# Patient Record
Sex: Male | Born: 1965 | Race: White | Hispanic: No | Smoking: Never smoker
Health system: Southern US, Community
[De-identification: ages and names within clinical notes are randomized; demographics above are authoritative.]

## PROBLEM LIST (undated history)

## (undated) DIAGNOSIS — I10 Essential (primary) hypertension: Secondary | ICD-10-CM

## (undated) DIAGNOSIS — E119 Type 2 diabetes mellitus without complications: Secondary | ICD-10-CM

## (undated) HISTORY — PX: BACK SURGERY: SHX140

## (undated) HISTORY — PX: KNEE SURGERY: SHX244

---

## 1998-08-25 ENCOUNTER — Encounter: Payer: Self-pay | Admitting: *Deleted

## 1998-08-25 ENCOUNTER — Ambulatory Visit (HOSPITAL_COMMUNITY): Admission: RE | Admit: 1998-08-25 | Discharge: 1998-08-25 | Payer: Self-pay | Admitting: *Deleted

## 2002-04-08 ENCOUNTER — Encounter: Payer: Self-pay | Admitting: Emergency Medicine

## 2002-04-08 ENCOUNTER — Emergency Department (HOSPITAL_COMMUNITY): Admission: AC | Admit: 2002-04-08 | Discharge: 2002-04-08 | Payer: Self-pay

## 2002-04-11 ENCOUNTER — Emergency Department (HOSPITAL_COMMUNITY): Admission: EM | Admit: 2002-04-11 | Discharge: 2002-04-11 | Payer: Self-pay | Admitting: Emergency Medicine

## 2002-05-07 ENCOUNTER — Encounter: Payer: Self-pay | Admitting: Family Medicine

## 2002-05-07 ENCOUNTER — Encounter: Admission: RE | Admit: 2002-05-07 | Discharge: 2002-05-07 | Payer: Self-pay | Admitting: Family Medicine

## 2002-06-23 ENCOUNTER — Ambulatory Visit (HOSPITAL_COMMUNITY): Admission: RE | Admit: 2002-06-23 | Discharge: 2002-06-23 | Payer: Self-pay | Admitting: Orthopedic Surgery

## 2002-06-23 ENCOUNTER — Encounter: Payer: Self-pay | Admitting: Orthopedic Surgery

## 2004-05-24 ENCOUNTER — Emergency Department (HOSPITAL_COMMUNITY): Admission: EM | Admit: 2004-05-24 | Discharge: 2004-05-24 | Payer: Self-pay | Admitting: Emergency Medicine

## 2006-11-19 ENCOUNTER — Ambulatory Visit: Payer: Self-pay | Admitting: Cardiovascular Disease

## 2006-11-19 ENCOUNTER — Observation Stay (HOSPITAL_COMMUNITY): Admission: EM | Admit: 2006-11-19 | Discharge: 2006-11-20 | Payer: Self-pay | Admitting: Emergency Medicine

## 2006-11-29 ENCOUNTER — Ambulatory Visit: Payer: Self-pay

## 2010-09-27 NOTE — Discharge Summary (Signed)
NAME:  HALE, Adrian Gonzalez NO.:  [id number]   MEDICAL RECORD NO.:  [id number]          PATIENT TYPE:  INP   LOCATION:  2041                         FACILITY:  [facility]   PHYSICIAN:  Noralyn Pick. Eden Emms, MD, FACCDATE OF BIRTH:  01/23/1966   DATE OF ADMISSION:  11/19/2006  DATE OF DISCHARGE:                               DISCHARGE SUMMARY   PRIMARY CARE Caroll Weinheimer:  Lonie Peak, PA-C   PRIMARY CARDIOLOGIST:  Dr. Charlton Haws   DISCHARGE DIAGNOSIS:  Chest pain.   SECONDARY DIAGNOSES:  1. Hypertension.  2. Anxiety.  3. History of back surgery in 1998.  4. History of left knee surgery in 1996.   ALLERGIES:  PENICILLIN.   PROCEDURES:  Chest CT.   HISTORY OF PRESENT ILLNESS:  This is a 45 year old Caucasian male with  prior history of hypertension and anxiety attacks who was in his usual  state of health until approximately 10:30 a.m. on November 19, 2006, when  while walking, he developed 5/10 mid sternal chest discomfort associated  with dizziness, nausea, diaphoresis, and chills, partially relieved by  one packet of BC Powder.  The patient presented to the [facility] ED  where ECG showed no acute changes and cardiac markers were negative.  He  was admitted for further evaluation.   HOSPITAL COURSE:  Mr. Dicenzo ruled out for MI by cardiac markers.  A CT  of the chest was performed and was negative for pulmonary embolus.  He  has not had any recurrent chest discomfort and will be discharged home  today in satisfactory condition.  The patient has complained of some  anxiety and we have recommended primary care and/or outpatient  psychiatry followup this week.   DISCHARGE LABORATORIES:  Hemoglobin 14.8, hematocrit 42.3, WBC 4.2,  platelets 180, MCV 89.9, neutrophils 58.  Sodium 137, potassium 4.1,  chloride 106, CO2 27, BUN 9, creatinine 1.00, glucose 114.  PT 12.6, INR  0.9, PTT 25.  CK 138, MB 2.7, troponin I 0.01.  Total cholesterol 146,  triglycerides 128, HDL 27, LDL  93.  Calcium 8.9, magnesium 1.9.   DISPOSITION:  The patient is being discharged home today in good  condition.   FOLLOWUP PLANS AND APPOINTMENTS:  The patient is asked to follow up with  his primary care Miyoshi Ligas this week.  He has a followup exercise Myoview  on July 17 at 8:15 a.m. at our office on [facility] in [place].   DISCHARGE MEDICATIONS:  1. Zestril 20 mg daily.  2. Aspirin 81 mg daily.   OUTSTANDING LABORATORY STUDIES:  None.   DURATION OF DISCHARGE ENCOUNTER:  35 minutes including physician time      Nicolasa Ducking, ANP      Noralyn Pick. Eden Emms, MD, [facility]  Electronically Signed    CB/MEDQ  D:  11/20/2006  T:  11/20/2006  Job:  [id number]   cc:   Lonie Peak, PA-C

## 2010-09-27 NOTE — H&P (Signed)
NAME:  Adrian Gonzalez, Adrian Gonzalez NO.:  [id number]   MEDICAL RECORD NO.:  [id number]          PATIENT TYPE:  INP   LOCATION:  1832                         FACILITY:  [facility]   PHYSICIAN:  Noralyn Pick. Eden Emms, MD, FACCDATE OF BIRTH:  11/18/1965   DATE OF ADMISSION:  11/19/2006  DATE OF DISCHARGE:                              HISTORY & PHYSICAL   PRIMARY PHYSICIANS:  Primary cardiologist will be new, Dr. Charlton Haws.  Primary care physician is Dr. Lonie Peak.   HISTORY OF PRESENT ILLNESS:  A 45 year old Caucasian male with no prior  history of coronary artery disease, but with a history of hypertension,  indigestion and anxiety attacks, had an episode of chest tightness,  felt like someone put a cinder block on my chest, around 10:30 AM  while walking at work.  It was associated with dizziness, nausea,  diaphoresis and chills.  It was midsternal with no radiation.  The pain  was constant 5/10.  The patient took one [facility] Powder.  It felt like it  helped the chest pain, but did not relieve.  He had no aggravating  factors to illicit chest pain.  He denies any shortness of breath with  the symptoms.  He denies any heavy lifting or strenuous activity prior  to this episode.  The patient has never been seen by cardiologists or  had this discomfort in the past.  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  He has also recently had two eight-month  twins which have added a good bit of stress to his life and lack of  sleep.  As a result of the continuation of chest discomfort, the patient  came to the emergency room.  On arrival to the emergency room, the  patient was given nitroglycerin and aspirin and the pain has subsided.  He is now on a nitroglycerin drip at 20 mcg per kg.  The patient's vital  signs are 143/78, pulse 65, respirations 16.   REVIEW OF SYSTEMS:  As above.  Positive for chest  pain, diaphoresis,  chills, indigestion, otherwise negative.   PAST MEDICAL HISTORY:  Hypertension and anxiety with two previous panic  attacks over the last six months.   PAST SURGICAL HISTORY:  Left knee surgery in 1996 and back surgery in  1998.   SOCIAL HISTORY:  [place] and  two eight-month-old twins.  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  Denies use of tobacco.  He has a remote history of smoking a pipe.  He  has beer and wine approximately four drinks a week.  No illicit drug  use.  For exercise, he walks a lot and does a lot of manual labor.   FAMILY HISTORY:  Mother with hypertension.  Father died of a gunshot  wound.  He has a brother who is alive and well.   MEDICATIONS:  Current medications at home:  Zestril 20 mg once a day.   ALLERGIES:  ALLERGIES TO PENICILLIN.   LABORATORY DATA:  Sodium 138, potassium of 3.7, chloride of 107, BUN 10,  creatinine 1.1, glucose 133, hemoglobin 14.8, hematocrit 42.3, white  blood cells 4.2, platelets 180,000.  Point of care troponin less than  0.05 and less than 0.05 times two.  Myoglobin 60.9 and 68.4  respectively.  EKG revealing normal sinus rhythm with a ventricular rate  of 90 beats per minute with no hypertrophy and no changes of ischemia  noted.  There is no previous EKG for comparison.  Chest x-ray is  pending.   PHYSICAL EXAMINATION:  VITAL SIGNS:  Blood pressure 143/78.  Heart rate  65.  Respirations 15.  Temperature of 98.3.  O2 sat 98% on room air.  HEENT:  Head is normocephalic, atraumatic.  Eyes:  PERRLA.  Mucous  membranes of the mouth are pink and moist.  Tongue is midline.  Neck is  supple.  No JVD, no carotid bruits appreciated.  CARDIOVASCULAR:  Regular rate and rhythm without murmurs, rubs or  gallops.  Pulses are 2+ and equal bilaterally.  RESPIRATORY:  Lungs are clear to auscultation without wheezes, rales or  rhonchi.  ABDOMEN:  Soft, nontender, obese with 2+ bowel  sounds.  EXTREMITIES:  Without clubbing, cyanosis or edema.  NEURO:  Cranial nerves II through XII are grossly intact.   IMPRESSION:  1. Atypical chest pain.  2. Anxiety.  3. Rule out gastroesophageal reflux disease.  4. Hypertension.   PLAN:  This is a 45 year old Caucasian male who was at work this morning  experiencing substernal chest pain, not radiating, with associated  diaphoresis, nausea and chills.  The patient's EKG is normal.  Troponins  are normal times two.  The patient will be admitted to rule out MI.  We  will cycle cardiac enzymes.  We will start Lopressor 12.5 mg b.i.d. and  place the patient on Toradol 50 mg p.r.n. pain, Ativan for anxiety, and  a proton pump inhibitor.  The patient will be restarted on his Zestril  for hypertensive control.  The patient will have a CT scan to rule out  PE and aortic aneurysm.  Echocardiogram will be evaluating him for wall  motion abnormalities.  If echo was normal, we will plan an outpatient  Myoview study.  This has been discussed with the patient, who verbalized  understanding and is willing to be admitted under observation.      Bettey Mare. Lyman Bishop, NP      Noralyn Pick. Eden Emms, MD, [facility]  Electronically Signed    KML/MEDQ  D:  11/19/2006  T:  11/19/2006  Job:  [id number]

## 2011-02-28 LAB — I-STAT 8, (EC8 V) (CONVERTED LAB)
BUN: 10
Bicarbonate: 21.8
HCT: 44
Hemoglobin: 15
Operator id: 288331
Potassium: 3.7
Sodium: 138

## 2011-02-28 LAB — POCT CARDIAC MARKERS
CKMB, poc: 2
CKMB, poc: 2.1
Myoglobin, poc: 68.4
Operator id: 288331
Operator id: 288331
Troponin i, poc: <0.05
Troponin i, poc: <0.05
Troponin i, poc: <0.05

## 2011-02-28 LAB — CARDIAC PANEL(CRET KIN+CKTOT+MB+TROPI)
CK, MB: 2.7
Relative Index: 2
Relative Index: 2.1
Troponin I: 0.01

## 2011-02-28 LAB — DIFFERENTIAL
Eosinophils Absolute: 0.2
Lymphocytes Relative: 31
Lymphs Abs: 1.3
Neutro Abs: 2.4
Neutrophils Relative %: 58

## 2011-02-28 LAB — BASIC METABOLIC PANEL
BUN: 9
Creatinine, Ser: 1
GFR calc non Af Amer: >60
Potassium: 4.1

## 2011-02-28 LAB — CBC
MCV: 89.9
Platelets: 180
WBC: 4.2

## 2011-02-28 LAB — POCT I-STAT CREATININE: Creatinine, Ser: 1.1

## 2011-02-28 LAB — MAGNESIUM: Magnesium: 1.9

## 2015-10-04 ENCOUNTER — Emergency Department (HOSPITAL_COMMUNITY)
Admission: EM | Admit: 2015-10-04 | Discharge: 2015-10-04 | Disposition: A | Discharge status: Home / Self Care | Payer: Self-pay | Attending: Emergency Medicine | Admitting: Emergency Medicine

## 2015-10-04 ENCOUNTER — Encounter (HOSPITAL_COMMUNITY): Payer: Self-pay | Admitting: Emergency Medicine

## 2015-10-04 DIAGNOSIS — Z79899 Other long term (current) drug therapy: Secondary | ICD-10-CM | POA: Insufficient documentation

## 2015-10-04 DIAGNOSIS — Z7984 Long term (current) use of oral hypoglycemic drugs: Secondary | ICD-10-CM | POA: Insufficient documentation

## 2015-10-04 DIAGNOSIS — I10 Essential (primary) hypertension: Secondary | ICD-10-CM | POA: Insufficient documentation

## 2015-10-04 DIAGNOSIS — R739 Hyperglycemia, unspecified: Secondary | ICD-10-CM

## 2015-10-04 DIAGNOSIS — Z791 Long term (current) use of non-steroidal anti-inflammatories (NSAID): Secondary | ICD-10-CM | POA: Insufficient documentation

## 2015-10-04 DIAGNOSIS — Z79891 Long term (current) use of opiate analgesic: Secondary | ICD-10-CM | POA: Insufficient documentation

## 2015-10-04 DIAGNOSIS — E1165 Type 2 diabetes mellitus with hyperglycemia: Secondary | ICD-10-CM | POA: Insufficient documentation

## 2015-10-04 HISTORY — DX: Essential (primary) hypertension: I10

## 2015-10-04 HISTORY — DX: Type 2 diabetes mellitus without complications: E11.9

## 2015-10-04 LAB — CBC
HCT: 42.1 % (ref 39.0–52.0)
Hemoglobin: 15.2 g/dL (ref 13.0–17.0)
MCH: 31.7 pg (ref 26.0–34.0)
MCHC: 36.1 g/dL — ABNORMAL HIGH (ref 30.0–36.0)
MCV: 87.7 fL (ref 78.0–100.0)
PLATELETS: 195 10*3/uL (ref 150–400)
RBC: 4.8 MIL/uL (ref 4.22–5.81)
RDW: 12.6 % (ref 11.5–15.5)
WBC: 6.7 10*3/uL (ref 4.0–10.5)

## 2015-10-04 LAB — URINALYSIS, ROUTINE W REFLEX MICROSCOPIC
Bilirubin Urine: NEGATIVE
Glucose, UA: >1000 mg/dL — AB
Hgb urine dipstick: NEGATIVE
KETONES UR: NEGATIVE mg/dL
LEUKOCYTES UA: NEGATIVE
NITRITE: NEGATIVE
PROTEIN: NEGATIVE mg/dL
Specific Gravity, Urine: 1.031 — ABNORMAL HIGH (ref 1.005–1.030)
pH: 5.5 (ref 5.0–8.0)

## 2015-10-04 LAB — BASIC METABOLIC PANEL
Anion gap: 8 (ref 5–15)
BUN: 22 mg/dL — AB (ref 6–20)
CHLORIDE: 99 mmol/L — AB (ref 101–111)
CO2: 26 mmol/L (ref 22–32)
CREATININE: 0.84 mg/dL (ref 0.61–1.24)
Calcium: 9.9 mg/dL (ref 8.9–10.3)
Glucose, Bld: 338 mg/dL — ABNORMAL HIGH (ref 65–99)
POTASSIUM: 4.1 mmol/L (ref 3.5–5.1)
SODIUM: 133 mmol/L — AB (ref 135–145)

## 2015-10-04 LAB — URINE MICROSCOPIC-ADD ON: Bacteria, UA: NONE SEEN

## 2015-10-04 LAB — CBG MONITORING, ED
GLUCOSE-CAPILLARY: 374 mg/dL — AB (ref 65–99)
GLUCOSE-CAPILLARY: 335 mg/dL — AB (ref 65–99)
GLUCOSE-CAPILLARY: 280 mg/dL — AB (ref 65–99)

## 2015-10-04 MED ORDER — GLIPIZIDE ER 5 MG PO TB24: 5.0000 mg | ORAL_TABLET | Freq: Every day | ORAL | Status: DC

## 2015-10-04 MED ORDER — METFORMIN HCL ER 500 MG PO TB24: 2000.0000 mg | ORAL_TABLET | Freq: Every day | ORAL | Status: DC

## 2015-10-04 MED ORDER — SODIUM CHLORIDE 0.9 % IV BOLUS (SEPSIS)
1000.0000 mL | Freq: Once | INTRAVENOUS | Status: AC
  Administered 2015-10-04: 1000 mL via INTRAVENOUS

## 2015-10-04 NOTE — ED Notes (Signed)
Patient states CBG was 315 today. Patient out of his medications x week.  Patient called MD today and states that his MD may have called prescriptions into pharmacy.  Patient drinking Diet Coke and eating while in lobby when called to come back.

## 2015-10-04 NOTE — ED Notes (Signed)
Questioned pt if he had called PCP to request assistance with obtaining diabetes meds.  Pt stated "no, I have not."

## 2015-10-04 NOTE — ED Provider Notes (Signed)
CSN: [id number]     Arrival date & time 10/04/15  1557 History   First MD Initiated Contact with Patient 10/04/15 1809     Chief Complaint  Patient presents with  . Hyperglycemia     (Consider location/radiation/quality/duration/timing/severity/associated sxs/prior Treatment) HPI   Started slowly this AM, headache and blurred vision this AM, had nurse at work check glucose and itw as 315. Now dull headache, has history of similar when glucose becomes elevated, starts slowly then gets worse, vision gets blurry, consistent with priro hyperglycemia.  Out of medication for one week, taking metformin and glipizide and out of both. Thinks physician has called them in.   Past Medical History  Diagnosis Date  . Diabetes mellitus without complication (HCC)   . Hypertension    Past Surgical History  Procedure Laterality Date  . Knee surgery    . Back surgery     No family history on file. Social History  Substance Use Topics  . Smoking status: Never Smoker   . Smokeless tobacco: None  . Alcohol Use: No    Review of Systems  Constitutional: Negative for fever.  HENT: Negative for sore throat.   Eyes: Negative for visual disturbance.  Respiratory: Negative for shortness of breath.   Cardiovascular: Negative for chest pain.  Gastrointestinal: Negative for nausea, vomiting, abdominal pain and diarrhea.  Endocrine: Positive for polydipsia and polyuria.  Genitourinary: Positive for frequency. Negative for difficulty urinating.  Musculoskeletal: Negative for back pain and neck stiffness.  Skin: Negative for rash.  Neurological: Positive for headaches. Negative for syncope, weakness and numbness.      Allergies  Review of patient's allergies indicates no known allergies.  Home Medications   Prior to Admission medications   Medication Sig Start Date End Date Taking? Authorizing Provider  DULoxetine (CYMBALTA) 60 MG capsule Take 60 mg by mouth daily. 07/07/15  Yes Historical  Provider, MD  ibuprofen (ADVIL,MOTRIN) 200 MG tablet Take 400 mg by mouth every 6 (six) hours as needed for headache.   Yes Historical Provider, MD  losartan-hydrochlorothiazide (HYZAAR) 100-25 MG tablet Take 1 tablet by mouth daily. 09/20/15  Yes Historical Provider, MD  meloxicam (MOBIC) 15 MG tablet Take 15 mg by mouth daily as needed for pain.  09/21/15  Yes Historical Provider, MD  traMADol (ULTRAM) 50 MG tablet Take 50-100 mg by mouth 2 (two) times daily as needed for moderate pain.  08/26/15  Yes Historical Provider, MD  zolpidem (AMBIEN CR) 12.5 MG CR tablet Take 12.5 mg by mouth at bedtime. 09/03/15  Yes Historical Provider, MD  glipiZIDE (GLUCOTROL XL) 5 MG 24 hr tablet Take 1 tablet (5 mg total) by mouth daily with breakfast. 10/04/15   Alvira Monday, MD  metFORMIN (GLUCOPHAGE-XR) 500 MG 24 hr tablet Take 4 tablets (2,000 mg total) by mouth daily with breakfast. 10/04/15   Alvira Monday, MD   BP 145/85 mmHg  Pulse 74  Temp(Src) 98.6 F (37 C) (Oral)  Resp 15  SpO2 98% Physical Exam  Constitutional: He is oriented to person, place, and time. He appears well-developed and well-nourished. No distress.  HENT:  Head: Normocephalic and atraumatic.  Eyes: Conjunctivae and EOM are normal.  Neck: Normal range of motion.  Cardiovascular: Normal rate, regular rhythm, normal heart sounds and intact distal pulses.  Exam reveals no gallop and no friction rub.   No murmur heard. Pulmonary/Chest: Effort normal and breath sounds normal. No respiratory distress. He has no wheezes. He has no rales.  Abdominal: Soft. He  exhibits no distension. There is no tenderness. There is no guarding.  Musculoskeletal: He exhibits no edema.  Neurological: He is alert and oriented to person, place, and time.  Skin: Skin is warm and dry. He is not diaphoretic.  Nursing note and vitals reviewed.   ED Course  Procedures (including critical care time) Labs Review Labs Reviewed  BASIC METABOLIC PANEL - Abnormal;  Notable for the following:    Sodium 133 (*)    Chloride 99 (*)    Glucose, Bld 338 (*)    BUN 22 (*)    All other components within normal limits  CBC - Abnormal; Notable for the following:    MCHC 36.1 (*)    All other components within normal limits  URINALYSIS, ROUTINE W REFLEX MICROSCOPIC (NOT [facility]) - Abnormal; Notable for the following:    Specific Gravity, Urine 1.031 (*)    Glucose, UA >1000 (*)    All other components within normal limits  URINE MICROSCOPIC-ADD ON - Abnormal; Notable for the following:    Squamous Epithelial / LPF 0-5 (*)    All other components within normal limits  CBG MONITORING, ED - Abnormal; Notable for the following:    Glucose-Capillary 374 (*)    All other components within normal limits  CBG MONITORING, ED - Abnormal; Notable for the following:    Glucose-Capillary 335 (*)    All other components within normal limits  CBG MONITORING, ED - Abnormal; Notable for the following:    Glucose-Capillary 280 (*)    All other components within normal limits    Imaging Review No results found. I have personally reviewed and evaluated these images and lab results as part of my medical decision-making.   EKG Interpretation None      MDM   Final diagnoses:  Hyperglycemia   50yo male with hx of DM, htn presents with concern for hyperglycemia.  Pt reports symptoms consistent with prior hyperglycemia, and denies any current HA or blurred vision and headache began slowly, no trauma, no fevers, and normal neurologic exam and have low suspicion for [facility], SDH, CVA or meningitis.  Pt with hyperglycemia, likely secondary to being off of his medications for the last week.  Glucose decreased with IVF. No sign of DKA. Patient discharged in stable condition with understanding of reasons to return.    Alvira MondayErin Curby Carswell, MD 10/05/15 [phone]

## 2016-08-28 ENCOUNTER — Encounter (HOSPITAL_COMMUNITY): Payer: Self-pay | Admitting: *Deleted

## 2016-08-28 ENCOUNTER — Inpatient Hospital Stay (HOSPITAL_COMMUNITY)
Admission: EM | Admit: 2016-08-28 | Discharge: 2016-08-31 | DRG: 885 | Disposition: A | Discharge status: Other Acute Inpatient Hospital | Payer: Self-pay | Attending: Internal Medicine | Admitting: Internal Medicine

## 2016-08-28 ENCOUNTER — Inpatient Hospital Stay (HOSPITAL_COMMUNITY): Payer: Self-pay

## 2016-08-28 DIAGNOSIS — R197 Diarrhea, unspecified: Secondary | ICD-10-CM

## 2016-08-28 DIAGNOSIS — F419 Anxiety disorder, unspecified: Secondary | ICD-10-CM | POA: Diagnosis present

## 2016-08-28 DIAGNOSIS — F1010 Alcohol abuse, uncomplicated: Secondary | ICD-10-CM | POA: Diagnosis present

## 2016-08-28 DIAGNOSIS — R12 Heartburn: Secondary | ICD-10-CM | POA: Diagnosis present

## 2016-08-28 DIAGNOSIS — I10 Essential (primary) hypertension: Secondary | ICD-10-CM | POA: Diagnosis present

## 2016-08-28 DIAGNOSIS — R45851 Suicidal ideations: Secondary | ICD-10-CM | POA: Diagnosis present

## 2016-08-28 DIAGNOSIS — R000 Tachycardia, unspecified: Secondary | ICD-10-CM

## 2016-08-28 DIAGNOSIS — R739 Hyperglycemia, unspecified: Secondary | ICD-10-CM

## 2016-08-28 DIAGNOSIS — E1110 Type 2 diabetes mellitus with ketoacidosis without coma: Secondary | ICD-10-CM | POA: Diagnosis present

## 2016-08-28 DIAGNOSIS — R072 Precordial pain: Secondary | ICD-10-CM

## 2016-08-28 DIAGNOSIS — E875 Hyperkalemia: Secondary | ICD-10-CM

## 2016-08-28 DIAGNOSIS — W1830XA Fall on same level, unspecified, initial encounter: Secondary | ICD-10-CM | POA: Diagnosis present

## 2016-08-28 DIAGNOSIS — Z7984 Long term (current) use of oral hypoglycemic drugs: Secondary | ICD-10-CM

## 2016-08-28 DIAGNOSIS — E860 Dehydration: Secondary | ICD-10-CM | POA: Diagnosis present

## 2016-08-28 DIAGNOSIS — S0990XA Unspecified injury of head, initial encounter: Secondary | ICD-10-CM

## 2016-08-28 DIAGNOSIS — S0083XA Contusion of other part of head, initial encounter: Secondary | ICD-10-CM | POA: Diagnosis present

## 2016-08-28 DIAGNOSIS — F332 Major depressive disorder, recurrent severe without psychotic features: Principal | ICD-10-CM | POA: Diagnosis present

## 2016-08-28 DIAGNOSIS — E1111 Type 2 diabetes mellitus with ketoacidosis with coma: Secondary | ICD-10-CM

## 2016-08-28 DIAGNOSIS — Z720 Tobacco use: Secondary | ICD-10-CM

## 2016-08-28 DIAGNOSIS — Z599 Problem related to housing and economic circumstances, unspecified: Secondary | ICD-10-CM

## 2016-08-28 LAB — COMPREHENSIVE METABOLIC PANEL
ALBUMIN: 4.7 g/dL (ref 3.5–5.0)
ALK PHOS: 82 U/L (ref 38–126)
ALT: 21 U/L (ref 17–63)
AST: 28 U/L (ref 15–41)
Anion gap: 12 (ref 5–15)
BUN: 9 mg/dL (ref 6–20)
CALCIUM: 9.2 mg/dL (ref 8.9–10.3)
CHLORIDE: 103 mmol/L (ref 101–111)
CO2: 18 mmol/L — AB (ref 22–32)
CREATININE: 0.9 mg/dL (ref 0.61–1.24)
GFR calc Af Amer: >60 mL/min (ref >60)
GFR calc non Af Amer: >60 mL/min (ref >60)
GLUCOSE: 338 mg/dL — AB (ref 65–99)
Potassium: 5.2 mmol/L — ABNORMAL HIGH (ref 3.5–5.1)
SODIUM: 133 mmol/L — AB (ref 135–145)
Total Bilirubin: 2.1 mg/dL — ABNORMAL HIGH (ref 0.3–1.2)
Total Protein: 7.9 g/dL (ref 6.5–8.1)

## 2016-08-28 LAB — I-STAT CHEM 8, ED
BUN: 10 mg/dL (ref 6–20)
Calcium, Ion: 1.08 mmol/L — ABNORMAL LOW (ref 1.15–1.40)
Chloride: 103 mmol/L (ref 101–111)
Creatinine, Ser: 0.8 mg/dL (ref 0.61–1.24)
Glucose, Bld: 382 mg/dL — ABNORMAL HIGH (ref 65–99)
HEMATOCRIT: 45 % (ref 39.0–52.0)
HEMOGLOBIN: 15.3 g/dL (ref 13.0–17.0)
POTASSIUM: 5.4 mmol/L — AB (ref 3.5–5.1)
SODIUM: 136 mmol/L (ref 135–145)
TCO2: 24 mmol/L (ref 0–100)

## 2016-08-28 LAB — BLOOD GAS, VENOUS
Acid-base deficit: 2 mmol/L (ref 0.0–2.0)
Bicarbonate: 20.5 mmol/L (ref 20.0–28.0)
O2 Saturation: 95 %
PH VEN: 7.439 — AB (ref 7.250–7.430)
Patient temperature: 98.6
pCO2, Ven: 30.7 mmHg — ABNORMAL LOW (ref 44.0–60.0)
pO2, Ven: 72.7 mmHg — ABNORMAL HIGH (ref 32.0–45.0)

## 2016-08-28 LAB — BASIC METABOLIC PANEL
ANION GAP: 8 (ref 5–15)
Anion gap: 8 (ref 5–15)
BUN: 13 mg/dL (ref 6–20)
BUN: 15 mg/dL (ref 6–20)
CALCIUM: 9.1 mg/dL (ref 8.9–10.3)
CHLORIDE: 106 mmol/L (ref 101–111)
CO2: 21 mmol/L — AB (ref 22–32)
CO2: 23 mmol/L (ref 22–32)
CREATININE: 0.99 mg/dL (ref 0.61–1.24)
Calcium: 8.9 mg/dL (ref 8.9–10.3)
Chloride: 104 mmol/L (ref 101–111)
Creatinine, Ser: 0.91 mg/dL (ref 0.61–1.24)
GFR calc Af Amer: >60 mL/min (ref >60)
GFR calc non Af Amer: >60 mL/min (ref >60)
Glucose, Bld: 326 mg/dL — ABNORMAL HIGH (ref 65–99)
Glucose, Bld: 375 mg/dL — ABNORMAL HIGH (ref 65–99)
Potassium: 4 mmol/L (ref 3.5–5.1)
Potassium: 3.6 mmol/L (ref 3.5–5.1)
Sodium: 135 mmol/L (ref 135–145)
Sodium: 135 mmol/L (ref 135–145)

## 2016-08-28 LAB — URINALYSIS, ROUTINE W REFLEX MICROSCOPIC
BILIRUBIN URINE: NEGATIVE
Hgb urine dipstick: NEGATIVE
KETONES UR: 20 mg/dL — AB
Leukocytes, UA: NEGATIVE
NITRITE: NEGATIVE
PH: 5 (ref 5.0–8.0)
Protein, ur: NEGATIVE mg/dL
SPECIFIC GRAVITY, URINE: 1.037 — AB (ref 1.005–1.030)

## 2016-08-28 LAB — TROPONIN I

## 2016-08-28 LAB — CBC
HEMATOCRIT: 46.6 % (ref 39.0–52.0)
Hemoglobin: 16.8 g/dL (ref 13.0–17.0)
MCH: 30.3 pg (ref 26.0–34.0)
MCHC: 36.1 g/dL — ABNORMAL HIGH (ref 30.0–36.0)
MCV: 84 fL (ref 78.0–100.0)
PLATELETS: 228 10*3/uL (ref 150–400)
RBC: 5.55 MIL/uL (ref 4.22–5.81)
RDW: 12.9 % (ref 11.5–15.5)
WBC: 10 10*3/uL (ref 4.0–10.5)

## 2016-08-28 LAB — CBG MONITORING, ED
GLUCOSE-CAPILLARY: 328 mg/dL — AB (ref 65–99)
GLUCOSE-CAPILLARY: 361 mg/dL — AB (ref 65–99)
Glucose-Capillary: 306 mg/dL — ABNORMAL HIGH (ref 65–99)
Glucose-Capillary: 360 mg/dL — ABNORMAL HIGH (ref 65–99)

## 2016-08-28 LAB — GLUCOSE, CAPILLARY: Glucose-Capillary: 315 mg/dL — ABNORMAL HIGH (ref 65–99)

## 2016-08-28 LAB — RAPID URINE DRUG SCREEN, HOSP PERFORMED
Amphetamines: NOT DETECTED
BARBITURATES: NOT DETECTED
BENZODIAZEPINES: NOT DETECTED
Cocaine: NOT DETECTED
Opiates: NOT DETECTED
Tetrahydrocannabinol: NOT DETECTED

## 2016-08-28 LAB — TSH
TSH: 0.759 u[IU]/mL (ref 0.350–4.500)
TSH: 0.998 u[IU]/mL (ref 0.350–4.500)

## 2016-08-28 LAB — ETHANOL: Alcohol, Ethyl (B): <5 mg/dL (ref <5)

## 2016-08-28 MED ORDER — THIAMINE HCL 100 MG/ML IJ SOLN: 100.0000 mg | Freq: Every day | INTRAMUSCULAR | Status: DC

## 2016-08-28 MED ORDER — ADULT MULTIVITAMIN W/MINERALS CH
1.0000 | ORAL_TABLET | Freq: Every day | ORAL | Status: DC
  Administered 2016-08-29 – 2016-08-31 (×3): 1 via ORAL
  Filled 2016-08-28 (×3): qty 1

## 2016-08-28 MED ORDER — FOLIC ACID 1 MG PO TABS
1.0000 mg | ORAL_TABLET | Freq: Every day | ORAL | Status: DC
  Administered 2016-08-28 – 2016-08-31 (×4): 1 mg via ORAL
  Filled 2016-08-28 (×4): qty 1

## 2016-08-28 MED ORDER — IBUPROFEN 200 MG PO TABS: 600.0000 mg | ORAL_TABLET | Freq: Three times a day (TID) | ORAL | Status: DC | PRN

## 2016-08-28 MED ORDER — SODIUM CHLORIDE 0.9 % IV SOLN: INTRAVENOUS | Status: DC

## 2016-08-28 MED ORDER — ENOXAPARIN SODIUM 40 MG/0.4ML ~~LOC~~ SOLN: 40.0000 mg | SUBCUTANEOUS | Status: DC

## 2016-08-28 MED ORDER — LORAZEPAM 1 MG PO TABS
0.0000 mg | ORAL_TABLET | Freq: Four times a day (QID) | ORAL | Status: AC
  Administered 2016-08-29: 1 mg via ORAL
  Filled 2016-08-28: qty 1

## 2016-08-28 MED ORDER — LORAZEPAM 2 MG/ML IJ SOLN
1.0000 mg | Freq: Once | INTRAMUSCULAR | Status: AC
  Administered 2016-08-28: 1 mg via INTRAVENOUS
  Filled 2016-08-28: qty 1

## 2016-08-28 MED ORDER — SODIUM CHLORIDE 0.9 % IV SOLN
INTRAVENOUS | Status: DC
Start: 2016-08-28 — End: 2016-08-29
  Administered 2016-08-28: 23:00:00 via INTRAVENOUS

## 2016-08-28 MED ORDER — METFORMIN HCL ER 750 MG PO TB24: 2000.0000 mg | ORAL_TABLET | Freq: Every day | ORAL | Status: DC

## 2016-08-28 MED ORDER — GI COCKTAIL ~~LOC~~
30.0000 mL | Freq: Once | ORAL | Status: DC
  Filled 2016-08-28: qty 30

## 2016-08-28 MED ORDER — ACETAMINOPHEN 325 MG PO TABS
650.0000 mg | ORAL_TABLET | Freq: Once | ORAL | Status: AC
  Administered 2016-08-28: 650 mg via ORAL
  Filled 2016-08-28: qty 2

## 2016-08-28 MED ORDER — DEXTROSE-NACL 5-0.45 % IV SOLN
INTRAVENOUS | Status: DC
Start: 2016-08-28 — End: 2016-08-28

## 2016-08-28 MED ORDER — SODIUM CHLORIDE 0.9 % IV SOLN
INTRAVENOUS | Status: DC
  Filled 2016-08-28: qty 2.5

## 2016-08-28 MED ORDER — ACETAMINOPHEN 325 MG PO TABS: 650.0000 mg | ORAL_TABLET | ORAL | Status: DC | PRN

## 2016-08-28 MED ORDER — LORAZEPAM 2 MG/ML IJ SOLN
1.0000 mg | Freq: Four times a day (QID) | INTRAMUSCULAR | Status: DC | PRN
  Administered 2016-08-29: 1 mg via INTRAVENOUS
  Filled 2016-08-28: qty 1

## 2016-08-28 MED ORDER — ONDANSETRON HCL 4 MG/2ML IJ SOLN: 4.0000 mg | Freq: Four times a day (QID) | INTRAMUSCULAR | Status: DC | PRN

## 2016-08-28 MED ORDER — GLIPIZIDE ER 5 MG PO TB24: 5.0000 mg | ORAL_TABLET | Freq: Every day | ORAL | Status: DC

## 2016-08-28 MED ORDER — SODIUM CHLORIDE 0.9 % IV BOLUS (SEPSIS)
2000.0000 mL | Freq: Once | INTRAVENOUS | Status: AC
  Administered 2016-08-28: 2000 mL via INTRAVENOUS

## 2016-08-28 MED ORDER — SODIUM CHLORIDE 0.9 % IV BOLUS (SEPSIS)
1000.0000 mL | Freq: Once | INTRAVENOUS | Status: AC
  Administered 2016-08-28: 1000 mL via INTRAVENOUS

## 2016-08-28 MED ORDER — SODIUM CHLORIDE 0.9% FLUSH
3.0000 mL | Freq: Two times a day (BID) | INTRAVENOUS | Status: DC
  Administered 2016-08-29 – 2016-08-30 (×3): 3 mL via INTRAVENOUS

## 2016-08-28 MED ORDER — SODIUM CHLORIDE 0.9 % IV SOLN
INTRAVENOUS | Status: DC
Start: 2016-08-28 — End: 2016-08-28

## 2016-08-28 MED ORDER — SODIUM CHLORIDE 0.9 % IV SOLN: INTRAVENOUS | Status: AC

## 2016-08-28 MED ORDER — ENOXAPARIN SODIUM 40 MG/0.4ML ~~LOC~~ SOLN
40.0000 mg | SUBCUTANEOUS | Status: DC
  Administered 2016-08-28 – 2016-08-30 (×3): 40 mg via SUBCUTANEOUS
  Filled 2016-08-28 (×3): qty 0.4

## 2016-08-28 MED ORDER — ONDANSETRON HCL 4 MG PO TABS: 4.0000 mg | ORAL_TABLET | Freq: Four times a day (QID) | ORAL | Status: DC | PRN

## 2016-08-28 MED ORDER — INSULIN ASPART 100 UNIT/ML ~~LOC~~ SOLN
8.0000 [IU] | Freq: Once | SUBCUTANEOUS | Status: AC
  Administered 2016-08-28: 8 [IU] via SUBCUTANEOUS
  Filled 2016-08-28: qty 1

## 2016-08-28 MED ORDER — INSULIN ASPART 100 UNIT/ML ~~LOC~~ SOLN
0.0000 [IU] | Freq: Every day | SUBCUTANEOUS | Status: DC
  Administered 2016-08-28: 7 [IU] via SUBCUTANEOUS
  Administered 2016-08-29: 3 [IU] via SUBCUTANEOUS
  Administered 2016-08-30: 7 [IU] via SUBCUTANEOUS

## 2016-08-28 MED ORDER — ACETAMINOPHEN 650 MG RE SUPP: 650.0000 mg | Freq: Four times a day (QID) | RECTAL | Status: DC | PRN

## 2016-08-28 MED ORDER — LORAZEPAM 1 MG PO TABS
1.0000 mg | ORAL_TABLET | Freq: Four times a day (QID) | ORAL | Status: DC | PRN
  Administered 2016-08-30 – 2016-08-31 (×2): 1 mg via ORAL

## 2016-08-28 MED ORDER — INSULIN ASPART 100 UNIT/ML ~~LOC~~ SOLN
0.0000 [IU] | Freq: Three times a day (TID) | SUBCUTANEOUS | Status: DC
  Administered 2016-08-29: 7 [IU] via SUBCUTANEOUS
  Administered 2016-08-29 (×2): 15 [IU] via SUBCUTANEOUS
  Administered 2016-08-30 (×2): 7 [IU] via SUBCUTANEOUS
  Administered 2016-08-30: 4 [IU] via SUBCUTANEOUS
  Administered 2016-08-31: 11 [IU] via SUBCUTANEOUS
  Administered 2016-08-31: 12 [IU] via SUBCUTANEOUS

## 2016-08-28 MED ORDER — LORAZEPAM 1 MG PO TABS
0.0000 mg | ORAL_TABLET | Freq: Two times a day (BID) | ORAL | Status: DC
  Filled 2016-08-28 (×2): qty 1

## 2016-08-28 MED ORDER — ACETAMINOPHEN 325 MG PO TABS
650.0000 mg | ORAL_TABLET | Freq: Four times a day (QID) | ORAL | Status: DC | PRN
  Administered 2016-08-28 – 2016-08-31 (×3): 650 mg via ORAL
  Filled 2016-08-28 (×3): qty 2

## 2016-08-28 MED ORDER — INSULIN GLARGINE 100 UNIT/ML ~~LOC~~ SOLN
20.0000 [IU] | Freq: Every day | SUBCUTANEOUS | Status: DC
  Administered 2016-08-28: 20 [IU] via SUBCUTANEOUS
  Filled 2016-08-28: qty 0.2

## 2016-08-28 MED ORDER — VITAMIN B-1 100 MG PO TABS
100.0000 mg | ORAL_TABLET | Freq: Every day | ORAL | Status: DC
  Administered 2016-08-28 – 2016-08-31 (×4): 100 mg via ORAL
  Filled 2016-08-28 (×4): qty 1

## 2016-08-28 NOTE — ED Notes (Signed)
Patient changed into paper scrubs. Patient and belongings wanded by security. 

## 2016-08-28 NOTE — Clinical Social Work Note (Signed)
Clinical Social Work Assessment  Patient Details  Name: Adrian Gonzalez MRN: [id number] Date of Birth: 1965/11/21  Date of referral:  08/28/16               Reason for consult:  Financial Concerns, Facility Placement                Permission sought to share information with:  Facility Art therapist granted to share information::  Yes, Verbal Permission Granted  Name::        Agency::     Relationship::     Contact Information:     Housing/Transportation Living arrangements for the past 2 months:  Single Family Home Source of Information:  Patient Patient Interpreter Needed:  None Criminal Activity/Legal Involvement Pertinent to Current Situation/Hospitalization:    Significant Relationships:  None Lives with:  Parents (Mother) Do you feel safe going back to the place where you live?  Yes Need for family participation in patient care:  No (Coment)  Care giving concerns:  None listed by pt/family    Social Worker assessment / plan:  CSW met with pt and confirmed pt's plan to be discharged to back home to live at discharge unless PT recommends SNF.  CSW provided active listening and validated pt's concerns.   CSW Dept given permission by the pt to complete FL-2 and send referrals out to SNF facilities via the hub per pt's request if PT recommends.  Pt has been living independently prior to being admitted to [facility].    Employment status:  Unemployed Forensic scientist:  Self Pay (Medicaid Pending) PT Recommendations:  Not assessed at this time Information / Referral to community resources:     Patient/Family's Response to care:  Patient alert and oriented.  Patient agreeable to plan.  Pt.'s Pt pleasant and appreciated CSW intervention and resources to assist with obtaining disability.    Patient/Family's Understanding of and Emotional Response to Diagnosis, Current Treatment, and Prognosis:  Still assessing   Emotional Assessment Appearance:  Appears stated  age Attitude/Demeanor/Rapport:    Affect (typically observed):  Accepting, Adaptable, Calm, Pleasant Orientation:  Oriented to Self, Oriented to Place, Oriented to  Time, Oriented to Situation Alcohol / Substance use:    Psych involvement (Current and /or in the community):     Discharge Needs  Concerns to be addressed:  No discharge needs identified Readmission within the last 30 days:  No Current discharge risk:  None Barriers to Discharge:  No Barriers Identified   Claudine Mouton, LCSWA 08/28/2016, 9:40 PM

## 2016-08-28 NOTE — ED Triage Notes (Signed)
Pt was brought in by family as they are concerned about him and feel he needs help.  Pt feels very depressed, denies any SI/HI.  He lost his wife 3 years ago, his children are in another state, he was recently incarcerated and his mother is in [facility] nursing home due to her health.  Pt lives at his mothers home and is having a difficult time caring for himself due to the depression.  Pt states that he is out of his DM meds and his "sugar is out of whack".  Pt denies any drug use and states that he drinks occasionally but denies having withdrawal when he doesn't drink.  Pt states that his last drink was yesterday.  Pt has a cough.  Pt has some abrasions (face and knee) from recent fall.  Pt also reports worsening back pain from fall.

## 2016-08-28 NOTE — ED Notes (Signed)
TTS in process 

## 2016-08-28 NOTE — ED Notes (Signed)
Patient given meal tray.

## 2016-08-28 NOTE — BH Assessment (Signed)
Tawni Levy NP recommends OBS. Patient not medically stable enough for the OBS unit at this time. [facility] to consider tomorrow.

## 2016-08-28 NOTE — ED Provider Notes (Addendum)
[facility]-EMERGENCY DEPT Provider Note   CSN: [id number] Arrival date & time: 08/28/16  1040     History   Chief Complaint Chief Complaint  Patient presents with  . Medical Clearance    HPI DONYALE BERTHOLD is a 51 y.o. male.  51 year old male presents requesting help with his depression. Denies any suicidal or homicidal ideations. Patient has been for several days and has caused him to not take his diabetic medications. States his sugars at home and been in the 400s and have been associated with polyuria as well as polydipsia. Denies any fever, vomiting, diarrhea. Denies any hallucinations. Occasional alcohol use reported but no chronic alcohol use. Denies any illicit drug use. Had a fall recently but denies any loss of consciousness. Has a history of chronic back pain no new neurological findings. No treatment used prior to arrival.      Past Medical History:  Diagnosis Date  . Diabetes mellitus without complication (HCC)   . Hypertension     There are no active problems to display for this patient.   Past Surgical History:  Procedure Laterality Date  . BACK SURGERY    . KNEE SURGERY         Home Medications    Prior to Admission medications   Medication Sig Start Date End Date Taking? Authorizing Provider  DULoxetine (CYMBALTA) 60 MG capsule Take 60 mg by mouth daily. 07/07/15   Historical Provider, MD  glipiZIDE (GLUCOTROL XL) 5 MG 24 hr tablet Take 1 tablet (5 mg total) by mouth daily with breakfast. 10/04/15   Alvira Monday, MD  ibuprofen (ADVIL,MOTRIN) 200 MG tablet Take 400 mg by mouth every 6 (six) hours as needed for headache.    Historical Provider, MD  losartan-hydrochlorothiazide (HYZAAR) 100-25 MG tablet Take 1 tablet by mouth daily. 09/20/15   Historical Provider, MD  meloxicam (MOBIC) 15 MG tablet Take 15 mg by mouth daily as needed for pain.  09/21/15   Historical Provider, MD  metFORMIN (GLUCOPHAGE-XR) 500 MG 24 hr tablet Take 4 tablets (2,000 mg total) by  mouth daily with breakfast. 10/04/15   Alvira Monday, MD  traMADol (ULTRAM) 50 MG tablet Take 50-100 mg by mouth 2 (two) times daily as needed for moderate pain.  08/26/15   Historical Provider, MD  zolpidem (AMBIEN CR) 12.5 MG CR tablet Take 12.5 mg by mouth at bedtime. 09/03/15   Historical Provider, MD    Family History No family history on file.  Social History Social History  Substance Use Topics  . Smoking status: Never Smoker  . Smokeless tobacco: Current User    Types: Snuff  . Alcohol use No     Allergies   Patient has no known allergies.   Review of Systems Review of Systems  All other systems reviewed and are negative.    Physical Exam Updated Vital Signs BP (!) 142/92 (BP Location: Right Arm)   Pulse (!) 147   Temp 98.6 F (37 C) (Oral)   Resp (!) 22   Wt 127 kg   SpO2 100%   Physical Exam  Constitutional: He is oriented to person, place, and time. He appears well-developed and well-nourished.  Non-toxic appearance. No distress.  HENT:  Head: Normocephalic and atraumatic.  Eyes: Conjunctivae, EOM and lids are normal. Pupils are equal, round, and reactive to light.  Neck: Normal range of motion. Neck supple. No tracheal deviation present. No thyroid mass present.  Cardiovascular: Regular rhythm and normal heart sounds.  Tachycardia present.  Exam  reveals no gallop.   No murmur heard. Pulmonary/Chest: Effort normal and breath sounds normal. No stridor. No respiratory distress. He has no decreased breath sounds. He has no wheezes. He has no rhonchi. He has no rales.  Abdominal: Soft. Normal appearance and bowel sounds are normal. He exhibits no distension. There is no tenderness. There is no rebound and no CVA tenderness.  Musculoskeletal: Normal range of motion. He exhibits no edema or tenderness.  Neurological: He is alert and oriented to person, place, and time. He has normal strength. No cranial nerve deficit or sensory deficit. GCS eye subscore is 4. GCS  verbal subscore is 5. GCS motor subscore is 6.  Skin: Skin is warm and dry. No abrasion and no rash noted.  Psychiatric: He has a normal mood and affect. His speech is normal and behavior is normal. He expresses no suicidal plans and no homicidal plans.  Nursing note and vitals reviewed.    ED Treatments / Results  Labs (all labs ordered are listed, but only abnormal results are displayed) Labs Reviewed  CBG MONITORING, ED - Abnormal; Notable for the following:       Result Value   Glucose-Capillary 328 (*)    All other components within normal limits  COMPREHENSIVE METABOLIC PANEL  ETHANOL  CBC  RAPID URINE DRUG SCREEN, HOSP PERFORMED  URINALYSIS, ROUTINE W REFLEX MICROSCOPIC  BLOOD GAS, VENOUS    EKG  EKG Interpretation None       Radiology No results found.  Procedures Procedures (including critical care time)  Medications Ordered in ED Medications  sodium chloride 0.9 % bolus 2,000 mL (not administered)  0.9 %  sodium chloride infusion (not administered)  acetaminophen (TYLENOL) tablet 650 mg (not administered)     Initial Impression / Assessment and Plan / ED Course  I have reviewed the triage vital signs and the nursing notes.  Pertinent labs & imaging results that were available during my care of the patient were reviewed by me and considered in my medical decision making (see chart for details).    Patient admits to increasing depression along with suicidal ideations . patient also has homicidal ideations towards another individual. He will require psychiatric treatment. Patient's blood sugar noted and was treated with IV fluids and insulin. Slight potassium elevation was also noted to. No evidence of DKA. Renal function is normal. Patient had a sinus tachycardia on arrival and it is decreased. Patient states that his baseline heart rate is about 120. He denies any dyspnea.    4:11 PM Pt rechecked and remains tachycardic to 140's despite tx Pt denies  chronic etoh use but a relative questions chronic abuse Maybe some component of withdrawal No dyspnea or chest pain No fever, doubt infectious Will consult medicine for  Final Clinical Impressions(s) / ED Diagnoses   Final diagnoses:  None    New Prescriptions New Prescriptions   No medications on file     Lorre Nick, MD 08/28/16 1525    Lorre Nick, MD 08/28/16 1615

## 2016-08-28 NOTE — BH Assessment (Signed)
Tele Assessment Note   Adrian Gonzalez is an 51 y.o. male presenting to the ED after he ran out of his diabetic medication. Reports worsening depression and anxiety. The patient denies SI but states he thinks about biting or cutting himself. No past history of attempts. Denies HI but admits to irritability and wanting to hit people and throwing things in the house. Denies A/V. The patient's father committed suicide by shooting and had issues with alcohol. The patient lives with his elderly mother who just received cancer treatment.   The patient reports multiple stressors: his wife died 3 yrs ago, and he lost custody of his twin girls 1.5 months ago. 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 States he drank last night, has a bruise on his right cheek after a fall. Denies drinking in past 2.5 yrs until last night. 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 Request to speak to a social worker about starting paperwork for disability.   The patient overeats and states he has probably gained weight. Has decreased sleep. Was disheveled in appearance, poor eye contact, appeared to be on his phone for most of the interview, had depressed mood, appropriate affect, fair judgement, was oriented to place and person, fair insight and impulse control. Denies any inpatient treatment. Denies any treatment from a psychiatrist. Reports taking anti depressants from his PCP but could not afford to continue them. Reports increase in anxiety: sweats, heart palpitations.   The patient states he has felony charges regarding a traffic violation. His next court dates is unknown to this clinician.   Tawni Levy NP recommends OBS  Diagnosis: Major Depressive Disorder, recurrent severe, without psychosis  Past Medical History:  Past Medical History:  Diagnosis Date  . Diabetes mellitus without complication (HCC)   . Hypertension     Past Surgical  History:  Procedure Laterality Date  . BACK SURGERY    . KNEE SURGERY      Family History: No family history on file.  Social History:  reports that he has never smoked. His smokeless tobacco use includes Snuff. He reports that he does not drink alcohol. His drug history is not on file.  Additional Social History:  Alcohol / Drug Use Pain Medications: see MAR Prescriptions: see MAR Over the Counter: see MAR History of alcohol / drug use?: Yes Substance #1 Name of Substance 1: alcohol 1 - Amount (size/oz): 3 to 4 drinks 1 - Frequency: relapsed yesterday 1 - Last Use / Amount: had 2.5 yrs sobriety  CIWA: CIWA-Ar BP: (!) 157/110 Pulse Rate: (!) 124 Nausea and Vomiting: no nausea and no vomiting Tactile Disturbances: none Tremor: no tremor Auditory Disturbances: not present Paroxysmal Sweats: barely perceptible sweating, palms moist Visual Disturbances: not present Anxiety: mildly anxious Headache, Fullness in Head: none present Agitation: normal activity Orientation and Clouding of Sensorium: oriented and can do serial additions CIWA-Ar Total: 2 COWS:    PATIENT STRENGTHS: (choose at least two) Average or above average intelligence General fund of knowledge  Allergies: No Known Allergies  Home Medications:  (Not in a hospital admission)  OB/GYN Status:  No LMP for male patient.  General Assessment Data Location of Assessment: [facility] TTS Assessment: In system Is this a Tele or Face-to-Face Assessment?: Tele Assessment Is this an Initial Assessment or a Re-assessment for this encounter?: Initial Assessment Marital status: Widowed Is patient pregnant?: No Pregnancy Status: No Living  Arrangements: Parent Can pt return to current living arrangement?: Yes Admission Status: Voluntary Is patient capable of signing voluntary admission?: Yes Referral Source: Self/Family/Friend Insurance type: self pay  Medical Screening Exam [facility] Walk-in ONLY) Medical Exam completed:  Yes  Crisis Care Plan Living Arrangements: Parent Name of Psychiatrist: n/a Name of Therapist: n/a  Education Status Is patient currently in school?: No Highest grade of school patient has completed: 12th grade  Risk to self with the past 6 months Suicidal Ideation: No Has patient been a risk to self within the past 6 months prior to admission? : No Suicidal Intent: No Has patient had any suicidal intent within the past 6 months prior to admission? : No Is patient at risk for suicide?: No Suicidal Plan?: No Has patient had any suicidal plan within the past 6 months prior to admission? : No Access to Means: No What has been your use of drugs/alcohol within the last 12 months?: periods of heavy alcohol use Previous Attempts/Gestures: No How many times?: 0 Other Self Harm Risks: 0 Intentional Self Injurious Behavior: None Family Suicide History: Yes (father shot himself) Recent stressful life event(s): Loss (Comment), Legal Issues Persecutory voices/beliefs?: No Depression: Yes Depression Symptoms: Feeling angry/irritable Substance abuse history and/or treatment for substance abuse?: Yes Suicide prevention information given to non-admitted patients: Not applicable  Risk to Others within the past 6 months Homicidal Ideation: No Does patient have any lifetime risk of violence toward others beyond the six months prior to admission? : No Thoughts of Harm to Others: Yes-Currently Present Comment - Thoughts of Harm to Others: thoughts to hit someone Current Homicidal Intent: No Current Homicidal Plan: No Access to Homicidal Means: No History of harm to others?: No Assessment of Violence: None Noted Does patient have access to weapons?: No Criminal Charges Pending?: Yes Describe Pending Criminal Charges: felony charges Does patient have a court date: Yes Is patient on probation?: No  Psychosis Hallucinations: None noted Delusions: None noted  Mental Status  Report Appearance/Hygiene: Disheveled, In scrubs Eye Contact: Fair Motor Activity: Freedom of movement Speech: Logical/coherent Level of Consciousness: Alert Mood: Depressed Affect: Appropriate to circumstance Anxiety Level: Moderate Thought Processes: Coherent, Relevant Judgement: Unimpaired Orientation: Person, Place, Situation Obsessive Compulsive Thoughts/Behaviors: None  Cognitive Functioning Concentration: Normal Memory: Recent Intact, Remote Intact IQ: Average Insight: Fair Impulse Control: Fair Appetite: Good Weight Gain:  (yes) Sleep: Decreased  ADLScreening [facility] Assessment Services) Patient's cognitive ability adequate to safely complete daily activities?: Yes Patient able to express need for assistance with ADLs?: Yes Independently performs ADLs?: Yes (appropriate for developmental age)  Prior Inpatient Therapy Prior Inpatient Therapy: No  Prior Outpatient Therapy Prior Outpatient Therapy: No Does patient have an ACCT team?: No Does patient have Intensive In-House Services?  : No Does patient have [facility] services? : No Does patient have P4CC services?: No  ADL Screening (condition at time of admission) Patient's cognitive ability adequate to safely complete daily activities?: Yes Is the patient deaf or have difficulty hearing?: No Does the patient have difficulty seeing, even when wearing glasses/contacts?: No Does the patient have difficulty concentrating, remembering, or making decisions?: Yes Patient able to express need for assistance with ADLs?: Yes Does the patient have difficulty dressing or bathing?: No Independently performs ADLs?: Yes (appropriate for developmental age) Does the patient have difficulty walking or climbing stairs?: Yes Weakness of Legs: Both Weakness of Arms/Hands: None  Home Assistive Devices/Equipment Home Assistive Devices/Equipment: None  Therapy Consults (therapy consults require a physician order) PT Evaluation Needed:  No OT Evalulation Needed: No SLP Evaluation Needed: No Abuse/Neglect Assessment (Assessment to be complete while patient is alone) Physical Abuse: Denies Verbal Abuse: Denies Sexual Abuse: Denies Exploitation of patient/patient's resources: Denies Self-Neglect: Denies Values / Beliefs Cultural Requests During Hospitalization: None Spiritual Requests During Hospitalization: None Consults Spiritual Care Consult Needed: No Social Work Consult Needed: No Merchant navy officer (For Healthcare) Does Patient Have a Medical Advance Directive?: No Nutrition Screen- [facility] Adult/[facility] Patient's home diet: Regular Has the patient recently lost weight without trying?: No Has the patient been eating poorly because of a decreased appetite?: No Malnutrition Screening Tool Score: 0  Additional Information 1:1 In Past 12 Months?: No CIRT Risk: No Elopement Risk: No Does patient have medical clearance?: No     Disposition:  Disposition Initial Assessment Completed for this Encounter: Yes Disposition of Patient: Inpatient treatment program Type of inpatient treatment program: Adult  Westley Hummer 08/28/2016 6:39 PM

## 2016-08-28 NOTE — ED Notes (Signed)
Patient given meal tray and soup.

## 2016-08-28 NOTE — ED Notes (Signed)
Bed: WTR5 Expected date:  Expected time:  Means of arrival:  Comments: 

## 2016-08-28 NOTE — Discharge Summary (Signed)
History and Physical  Adrian Gonzalez:[id number] DOB: [birth year] DOA: 08/28/2016   PCP: No PCP Per Patient   Patient coming from: Home  Chief Complaint: Home  HPI:  Adrian Gonzalez is a 51 y.o. male with medical history of diabetes mellitus presented to the emergency department seeking help for his depression and anxiety. The patient has had previous thoughts of suicide, but currently does not have any active suicidal ideation or plan. Upon further history given to the ER physician, the patient has been complaining of some dizziness with polydipsia and polyuria.  As a result, the patient was placed on telemetry and further workup was obtained. The patient was noted to be tachycardic into 140-150s. The patient states that he has run out of his diabetic medications over 1 week ago. He states he is having some financial difficulties. In addition, his dizziness has resulted in him falling and hitting his face on the evening of 08/27/2016. The patient denied loss of consciousness. He denies any headache or facial pain at this time. The patient complains of diarrhea having approximately 10 bowel movements per day for the last week. He denies any hematochezia or melena. He denies any recent travels or eating any raw or undercooked foods. He denies any fevers, chills, chest pain, shortness of breath, vomiting, abdominal pain. He denies any dysuria or hematuria.  In the emergency department, the patient was noted to be tachycardic in the 140s. He was hemodynamically stable and saturating 98% on room air. BMP showed potassium 5.2, bicarbonate 18 with anion gap of 12. Hepatic enzymes were unremarkable. WBC was 10.0. Hemoglobin 16.8. Alcohol level was undetectable. EKG shows sinus tachycardia with nonspecific T-wave changes. Urine drug screen was negative. A VBG showed pH 7.43, but UA showed ketonuria. Assessment/Plan: DKA type 2 --patient started on IV insulin with q 1 hour CBG check and q 4 hour  BMPs -pt started on aggressive fluid resuscitation -Electrolytes were monitored and repleted -transition to [place] insulin once anion gap closed -diet advancement once anion gap closed -HbA1C-  Dehydration -secondary to DKA  Sinus tachycardia -due to DKA/dehydration -concern about Etoh withdraw -CIWA -Echo -TSH/FreeT4  Face injury -secondary to mechanical fall -CT brain  Diarrhea -Check C. Difficile -Stool pathogen panel  Hyperkalemia -Anticipate improvement with IV fluid resuscitation  "Heartburn"/atypical chest discomfort -GI cocktail -Personally reviewed EKG--sinus tachycardia with nonspecific T-wave change -Echocardiogram -Cycle troponins -Urine drug screen negative  Depression -has passive SI -I have already consulted psychiatry--will see 4/17        Past Medical History:  Diagnosis Date  . Diabetes mellitus without complication (HCC)   . Hypertension    Past Surgical History:  Procedure Laterality Date  . BACK SURGERY    . KNEE SURGERY     Social History:  reports that he has never smoked. His smokeless tobacco use includes Snuff. He reports that he does not drink alcohol. His drug history is not on file.   No family history on file.   No Known Allergies   Prior to Admission medications   Medication Sig Start Date End Date Taking? Authorizing Provider  glipiZIDE (GLUCOTROL XL) 5 MG 24 hr tablet Take 1 tablet (5 mg total) by mouth daily with breakfast. 10/04/15  Yes Alvira Monday, MD  metFORMIN (GLUCOPHAGE-XR) 500 MG 24 hr tablet Take 4 tablets (2,000 mg total) by mouth daily with breakfast. 10/04/15  Yes Alvira Monday, MD    Review of Systems:  Constitutional:  No weight loss,  night sweats, Fevers, chills, fatigue.  Head&Eyes: No headache.  No vision loss.  No eye pain or scotoma ENT:  No Difficulty swallowing,Tooth/dental problems,Sore throat,  No ear ache, post nasal drip,  Cardio-vascular:  No chest pain, Orthopnea, PND, swelling  in lower extremities,  dizziness, palpitations  GI:  No  abdominal pain, nausea, vomiting, loss of appetite, hematochezia, melena, Resp:  No shortness of breath with exertion or at rest. No cough. No coughing up of blood .No wheezing.No chest wall deformity  Skin:  no rash or lesions.  GU:  no dysuria, change in color of urine, no urgency or frequency. No flank pain.  Musculoskeletal:  No joint pain or swelling. No decreased range of motion. No back pain.  Psych:  No change in mood or affect. No depression or anxiety. Neurologic: No headache, no dysesthesia, no focal weakness, no vision loss. No syncope  Physical Exam: Vitals:   08/28/16 1430 08/28/16 1500 08/28/16 1536 08/28/16 1541  BP: 134/84 (!) 113/94 (!) 182/120 (!) 147/78  Pulse: (!) 118 (!) 119 (!) 133 69  Resp: Temp:   98.7 F (37.1 C) 97.9 F (36.6 C)  TempSrc:   Oral Oral  SpO2: 98% 98% 98% 96%  Weight:       General:  A&O x 3, NAD, nontoxic, pleasant/cooperative Head/Eye: No conjunctival hemorrhage, no icterus, [place]/AT, No nystagmus ENT:  No icterus,  No thrush, good dentition, no pharyngeal exudate Neck:  No masses, no lymphadenpathy, no bruits CV:  RRR, no rub, no gallop, no S3 Lung:  CTAB, good air movement, no wheeze, no rhonchi Abdomen: soft/NT, +BS, nondistended, no peritoneal signs Ext: No cyanosis, No rashes, No petechiae, No lymphangitis, No edema Neuro: CNII-XII intact, strength 4/5 in bilateral upper and lower extremities, no dysmetria  Labs on Admission:  Basic Metabolic Panel:  Recent Labs Lab 08/28/16 1213 08/28/16 1411 08/28/16 1548  NA 133* 136 135  K 5.2* 5.4* 4.0  CL 103 103 106  CO2 18*  --  21*  GLUCOSE 338* 382* 326*  BUN CREATININE 0.90 0.80 0.99  CALCIUM 9.2  --  8.9   Liver Function Tests:  Recent Labs Lab 08/28/16 1213  AST 28  ALT 21  ALKPHOS 82  BILITOT 2.1*  PROT 7.9  ALBUMIN 4.7   No results for input(s): LIPASE, AMYLASE in the last 168  hours. No results for input(s): AMMONIA in the last 168 hours. CBC:  Recent Labs Lab 08/28/16 1213 08/28/16 1411  WBC 10.0  --   HGB 16.8 15.3  HCT 46.6 45.0  MCV 84.0  --   PLT 228  --    Coagulation Profile: No results for input(s): INR, PROTIME in the last 168 hours. Cardiac Enzymes: No results for input(s): CKTOTAL, CKMB, CKMBINDEX, TROPONINI in the last 168 hours. BNP: Invalid input(s): POCBNP CBG:  Recent Labs Lab 08/28/16 1047 08/28/16 1428 08/28/16 1540  GLUCAP 328* 361* 306*   Urine analysis:    Component Value Date/Time   COLORURINE YELLOW 08/28/2016 1310   APPEARANCEUR CLEAR 08/28/2016 1310   LABSPEC 1.037 (H) 08/28/2016 1310   PHURINE 5.0 08/28/2016 1310   GLUCOSEU >=500 (A) 08/28/2016 1310   HGBUR NEGATIVE 08/28/2016 1310   BILIRUBINUR NEGATIVE 08/28/2016 1310   KETONESUR 20 (A) 08/28/2016 1310   PROTEINUR NEGATIVE 08/28/2016 1310   NITRITE NEGATIVE 08/28/2016 1310   LEUKOCYTESUR NEGATIVE 08/28/2016 1310   Sepsis Labs: (procalcitonin:4,lacticidven:4) )No results found for this or any  previous visit (from the past 240 hour(s)).   Radiological Exams on Admission: No results found.  EKG: Independently reviewed. Sinus tachycardia, nonspecific T wave change    Time spent:60 minutes Code Status:   FULL Family Communication:  No Family at bedside Disposition Plan: expect 2-3 day hospitalization Consults called: psychiatry DVT Prophylaxis: Ramona Lovenox  Tulani Kidney, DO  [facility] Pager [phone]  If 7PM-7AM, please contact night-coverage [email or web address] Password [email or web address] 08/28/2016, 5:13 PM

## 2016-08-29 ENCOUNTER — Inpatient Hospital Stay (HOSPITAL_COMMUNITY): Payer: Self-pay

## 2016-08-29 DIAGNOSIS — E860 Dehydration: Secondary | ICD-10-CM | POA: Diagnosis not present

## 2016-08-29 DIAGNOSIS — R197 Diarrhea, unspecified: Secondary | ICD-10-CM | POA: Diagnosis not present

## 2016-08-29 DIAGNOSIS — R45851 Suicidal ideations: Secondary | ICD-10-CM | POA: Diagnosis not present

## 2016-08-29 DIAGNOSIS — F1010 Alcohol abuse, uncomplicated: Secondary | ICD-10-CM

## 2016-08-29 DIAGNOSIS — E1110 Type 2 diabetes mellitus with ketoacidosis without coma: Secondary | ICD-10-CM | POA: Diagnosis not present

## 2016-08-29 DIAGNOSIS — R072 Precordial pain: Secondary | ICD-10-CM

## 2016-08-29 DIAGNOSIS — E875 Hyperkalemia: Secondary | ICD-10-CM | POA: Diagnosis not present

## 2016-08-29 DIAGNOSIS — F332 Major depressive disorder, recurrent severe without psychotic features: Secondary | ICD-10-CM | POA: Diagnosis present

## 2016-08-29 DIAGNOSIS — R000 Tachycardia, unspecified: Secondary | ICD-10-CM | POA: Diagnosis not present

## 2016-08-29 LAB — CBC
HCT: 41.7 % (ref 39.0–52.0)
Hemoglobin: 14.5 g/dL (ref 13.0–17.0)
MCH: 29.9 pg (ref 26.0–34.0)
MCHC: 34.8 g/dL (ref 30.0–36.0)
MCV: 86 fL (ref 78.0–100.0)
PLATELETS: 187 10*3/uL (ref 150–400)
RBC: 4.85 MIL/uL (ref 4.22–5.81)
RDW: 12.9 % (ref 11.5–15.5)
WBC: 6.2 10*3/uL (ref 4.0–10.5)

## 2016-08-29 LAB — GLUCOSE, CAPILLARY
Glucose-Capillary: 291 mg/dL — ABNORMAL HIGH (ref 65–99)
Glucose-Capillary: 227 mg/dL — ABNORMAL HIGH (ref 65–99)
Glucose-Capillary: 345 mg/dL — ABNORMAL HIGH (ref 65–99)
Glucose-Capillary: 346 mg/dL — ABNORMAL HIGH (ref 65–99)
Glucose-Capillary: 239 mg/dL — ABNORMAL HIGH (ref 65–99)

## 2016-08-29 LAB — TROPONIN I: Troponin I: <0.03 ng/mL (ref <0.03)

## 2016-08-29 LAB — ECHOCARDIOGRAM COMPLETE
Height: 76 in
Weight: 4518.55 oz

## 2016-08-29 LAB — MRSA PCR SCREENING: MRSA by PCR: POSITIVE — AB

## 2016-08-29 LAB — BASIC METABOLIC PANEL
Anion gap: 7 (ref 5–15)
BUN: 13 mg/dL (ref 6–20)
CHLORIDE: 104 mmol/L (ref 101–111)
CO2: 24 mmol/L (ref 22–32)
Calcium: 8.9 mg/dL (ref 8.9–10.3)
Creatinine, Ser: 0.84 mg/dL (ref 0.61–1.24)
GFR calc non Af Amer: >60 mL/min (ref >60)
Glucose, Bld: 315 mg/dL — ABNORMAL HIGH (ref 65–99)
POTASSIUM: 3.9 mmol/L (ref 3.5–5.1)
SODIUM: 135 mmol/L (ref 135–145)

## 2016-08-29 LAB — HIV ANTIBODY (ROUTINE TESTING W REFLEX): HIV SCREEN 4TH GENERATION: NONREACTIVE

## 2016-08-29 LAB — T4, FREE: FREE T4: 1.18 ng/dL — AB (ref 0.61–1.12)

## 2016-08-29 MED ORDER — LISINOPRIL 10 MG PO TABS: 10.0000 mg | ORAL_TABLET | Freq: Every day | ORAL | Status: DC

## 2016-08-29 MED ORDER — DIPHENHYDRAMINE HCL 25 MG PO CAPS
25.0000 mg | ORAL_CAPSULE | Freq: Once | ORAL | Status: AC
  Administered 2016-08-29: 25 mg via ORAL
  Filled 2016-08-29: qty 1

## 2016-08-29 MED ORDER — CHLORHEXIDINE GLUCONATE CLOTH 2 % EX PADS
6.0000 | MEDICATED_PAD | Freq: Every day | CUTANEOUS | Status: DC
  Administered 2016-08-29 – 2016-08-30 (×2): 6 via TOPICAL

## 2016-08-29 MED ORDER — MUPIROCIN 2 % EX OINT
1.0000 "application " | TOPICAL_OINTMENT | Freq: Two times a day (BID) | CUTANEOUS | Status: DC
  Administered 2016-08-29 – 2016-08-31 (×6): 1 via NASAL
  Filled 2016-08-29 (×3): qty 22

## 2016-08-29 MED ORDER — LOSARTAN POTASSIUM 50 MG PO TABS
25.0000 mg | ORAL_TABLET | Freq: Every day | ORAL | Status: DC
  Administered 2016-08-29 – 2016-08-31 (×3): 25 mg via ORAL
  Filled 2016-08-29 (×3): qty 1

## 2016-08-29 MED ORDER — MIRTAZAPINE 15 MG PO TABS
15.0000 mg | ORAL_TABLET | Freq: Every day | ORAL | Status: DC
  Administered 2016-08-29 – 2016-08-30 (×2): 15 mg via ORAL
  Filled 2016-08-29 (×2): qty 1

## 2016-08-29 MED ORDER — INSULIN GLARGINE 100 UNIT/ML ~~LOC~~ SOLN
30.0000 [IU] | Freq: Every day | SUBCUTANEOUS | Status: DC
  Filled 2016-08-29: qty 0.3

## 2016-08-29 MED ORDER — POTASSIUM CHLORIDE IN NACL 20-0.9 MEQ/L-% IV SOLN
INTRAVENOUS | Status: AC
  Administered 2016-08-29 – 2016-08-30 (×3): via INTRAVENOUS
  Filled 2016-08-29 (×4): qty 1000

## 2016-08-29 MED ORDER — INSULIN ASPART 100 UNIT/ML ~~LOC~~ SOLN
5.0000 [IU] | Freq: Three times a day (TID) | SUBCUTANEOUS | Status: DC
  Administered 2016-08-29 – 2016-08-31 (×6): 5 [IU] via SUBCUTANEOUS

## 2016-08-29 MED ORDER — INSULIN ASPART 100 UNIT/ML ~~LOC~~ SOLN: 4.0000 [IU] | Freq: Three times a day (TID) | SUBCUTANEOUS | Status: DC

## 2016-08-29 MED ORDER — TRAMADOL HCL 50 MG PO TABS
100.0000 mg | ORAL_TABLET | Freq: Once | ORAL | Status: AC
  Administered 2016-08-29: 100 mg via ORAL
  Filled 2016-08-29: qty 2

## 2016-08-29 MED ORDER — KETOROLAC TROMETHAMINE 30 MG/ML IJ SOLN
30.0000 mg | Freq: Once | INTRAMUSCULAR | Status: AC
  Administered 2016-08-29: 30 mg via INTRAVENOUS
  Filled 2016-08-29: qty 1

## 2016-08-29 MED ORDER — HYDRALAZINE HCL 20 MG/ML IJ SOLN
5.0000 mg | Freq: Four times a day (QID) | INTRAMUSCULAR | Status: DC | PRN
  Administered 2016-08-29: 5 mg via INTRAVENOUS
  Filled 2016-08-29: qty 1

## 2016-08-29 MED ORDER — POTASSIUM CHLORIDE 2 MEQ/ML IV SOLN
INTRAVENOUS | Status: DC
  Filled 2016-08-29: qty 1000

## 2016-08-29 MED ORDER — FLUOXETINE HCL 20 MG PO CAPS
20.0000 mg | ORAL_CAPSULE | Freq: Every day | ORAL | Status: DC
  Administered 2016-08-29 – 2016-08-31 (×3): 20 mg via ORAL
  Filled 2016-08-29 (×3): qty 1

## 2016-08-29 MED ORDER — INSULIN GLARGINE 100 UNIT/ML ~~LOC~~ SOLN
35.0000 [IU] | Freq: Every day | SUBCUTANEOUS | Status: DC
  Administered 2016-08-29 – 2016-08-30 (×2): 35 [IU] via SUBCUTANEOUS
  Filled 2016-08-29 (×4): qty 0.35

## 2016-08-29 NOTE — H&P (Signed)
History and Physical  Adrian Gonzalez EAV:[id number] DOB: [birth year] DOA: 08/28/2016   PCP: No PCP Per Patient   Patient coming from: Home  Chief Complaint: Home  HPI:  Adrian Gonzalez is a 51 y.o. male with medical history of diabetes mellitus presented to the emergency department seeking help for his depression and anxiety. The patient has had previous thoughts of suicide, but currently does not have any active suicidal ideation or plan. Upon further history given to the ER physician, the patient has been complaining of some dizziness with polydipsia and polyuria.  As a result, the patient was placed on telemetry and further workup was obtained. The patient was noted to be tachycardic into 140-150s. The patient states that he has run out of his diabetic medications over 1 week ago. He states he is having some financial difficulties. In addition, his dizziness has resulted in him falling and hitting his face on the evening of 08/27/2016. The patient denied loss of consciousness. He denies any headache or facial pain at this time. The patient complains of diarrhea having approximately 10 bowel movements per day for the last week. He denies any hematochezia or melena. He denies any recent travels or eating any raw or undercooked foods. He denies any fevers, chills, chest pain, shortness of breath, vomiting, abdominal pain. He denies any dysuria or hematuria.  In the emergency department, the patient was noted to be tachycardic in the 140s. He was hemodynamically stable and saturating 98% on room air. BMP showed potassium 5.2, bicarbonate 18 with anion gap of 12. Hepatic enzymes were unremarkable. WBC was 10.0. Hemoglobin 16.8. Alcohol level was undetectable. EKG shows sinus tachycardia with nonspecific T-wave changes. Urine drug screen was negative. A VBG showed pH 7.43, but UA showed ketonuria. Assessment/Plan: DKA type 2 --patient started on IV insulin with q 1 hour CBG check and q 4 hour  BMPs -pt started on aggressive fluid resuscitation -Electrolytes were monitored and repleted -transition to [place] insulin once anion gap closed -diet advancement once anion gap closed -HbA1C-  Dehydration -secondary to DKA  Sinus tachycardia -due to DKA/dehydration -concern about Etoh withdraw -CIWA -Echo -TSH/FreeT4  Face injury -secondary to mechanical fall -CT brain  Diarrhea -Check C. Difficile -Stool pathogen panel  Hyperkalemia -Anticipate improvement with IV fluid resuscitation  "Heartburn"/atypical chest discomfort -GI cocktail -Personally reviewed EKG--sinus tachycardia with nonspecific T-wave change -Echocardiogram -Cycle troponins -Urine drug screen negative  Depression -has passive SI -I have already consulted psychiatry--will see 4/17         Past Medical History:  Diagnosis Date  . Diabetes mellitus without complication (HCC)   . Hypertension    Past Surgical History:  Procedure Laterality Date  . BACK SURGERY    . KNEE SURGERY     Social History:  reports that he has never smoked. His smokeless tobacco use includes Snuff. He reports that he does not drink alcohol. His drug history is not on file.   No family history on file.   No Known Allergies   Prior to Admission medications   Medication Sig Start Date End Date Taking? Authorizing Provider  glipiZIDE (GLUCOTROL XL) 5 MG 24 hr tablet Take 1 tablet (5 mg total) by mouth daily with breakfast. 10/04/15  Yes Alvira Monday, MD  metFORMIN (GLUCOPHAGE-XR) 500 MG 24 hr tablet Take 4 tablets (2,000 mg total) by mouth daily with breakfast. 10/04/15  Yes Alvira Monday, MD    Review of Systems:  Constitutional:  No weight  loss, night sweats, Fevers, chills, fatigue.  Head&Eyes: No headache.  No vision loss.  No eye pain or scotoma ENT:  No Difficulty swallowing,Tooth/dental problems,Sore throat,  No ear ache, post nasal drip,  Cardio-vascular:  No chest pain, Orthopnea, PND,  swelling in lower extremities,  dizziness, palpitations  GI:  No  abdominal pain, nausea, vomiting, diarrhea, loss of appetite, hematochezia, melena, heartburn, indigestion, Resp:  No shortness of breath with exertion or at rest. No cough. No coughing up of blood .No wheezing.No chest wall deformity  Skin:  no rash or lesions.  GU:  no dysuria, change in color of urine, no urgency or frequency. No flank pain.  Musculoskeletal:  No joint pain or swelling. No decreased range of motion. No back pain.  Psych:  No change in mood or affect. No depression or anxiety. Neurologic: No headache, no dysesthesia, no focal weakness, no vision loss. No syncope  Physical Exam: Vitals:   08/29/16 1200 08/29/16 1600 08/29/16 1712 08/29/16 1714  BP:   (!) 170/100 (!) 176/90  Pulse:      Resp:   (!) 21 17  Temp: 98.4 F (36.9 C) 98.1 F (36.7 C)    TempSrc: Oral Oral    SpO2:      Weight:      Height:       General:  A&O x 3, NAD, nontoxic, pleasant/cooperative Head/Eye: No conjunctival hemorrhage, no icterus, [place]/AT, No nystagmus ENT:  No icterus,  No thrush, good dentition, no pharyngeal exudate Neck:  No masses, no lymphadenpathy, no bruits CV:  RRR, no rub, no gallop, no S3 Lung:  CTAB, good air movement, no wheeze, no rhonchi Abdomen: soft/NT, +BS, nondistended, no peritoneal signs Ext: No cyanosis, No rashes, No petechiae, No lymphangitis, No edema Neuro: CNII-XII intact, strength 4/5 in bilateral upper and lower extremities, no dysmetria  Labs on Admission:  Basic Metabolic Panel:  Recent Labs Lab 08/28/16 1213 08/28/16 1411 08/28/16 1548 08/28/16 2205 08/29/16 1503  NA 133* 136 135 135 135  K 5.2* 5.4* 4.0 3.6 3.9  CL 103 103 106 104 104  CO2 18*  --  21* 23 24  GLUCOSE 338* 382* 326* 375* 315*  BUN CREATININE 0.90 0.80 0.99 0.91 0.84  CALCIUM 9.2  --  8.9 9.1 8.9   Liver Function Tests:  Recent Labs Lab 08/28/16 1213  AST 28  ALT 21  ALKPHOS 82    BILITOT 2.1*  PROT 7.9  ALBUMIN 4.7   No results for input(s): LIPASE, AMYLASE in the last 168 hours. No results for input(s): AMMONIA in the last 168 hours. CBC:  Recent Labs Lab 08/28/16 1213 08/28/16 1411 08/29/16 1503  WBC 10.0  --  6.2  HGB 16.8 15.3 14.5  HCT 46.6 45.0 41.7  MCV 84.0  --  86.0  PLT 228  --  187   Coagulation Profile: No results for input(s): INR, PROTIME in the last 168 hours. Cardiac Enzymes:  Recent Labs Lab 08/28/16 2205 08/29/16 1503  TROPONINI <0.03 <0.03   BNP: Invalid input(s): POCBNP CBG:  Recent Labs Lab 08/28/16 2329 08/29/16 0332 08/29/16 0806 08/29/16 1112 08/29/16 1629  GLUCAP 315* 291* 227* 345* 346*   Urine analysis:    Component Value Date/Time   COLORURINE YELLOW 08/28/2016 1310   APPEARANCEUR CLEAR 08/28/2016 1310   LABSPEC 1.037 (H) 08/28/2016 1310   PHURINE 5.0 08/28/2016 1310   GLUCOSEU >=500 (A) 08/28/2016 1310   HGBUR NEGATIVE 08/28/2016 1310  BILIRUBINUR NEGATIVE 08/28/2016 1310   KETONESUR 20 (A) 08/28/2016 1310   PROTEINUR NEGATIVE 08/28/2016 1310   NITRITE NEGATIVE 08/28/2016 1310   LEUKOCYTESUR NEGATIVE 08/28/2016 1310   Sepsis Labs: (procalcitonin:4,lacticidven:4) ) Recent Results (from the past 240 hour(s))  MRSA PCR Screening     Status: Abnormal   Collection Time: 08/28/16 10:25 PM  Result Value Ref Range Status   MRSA by PCR POSITIVE (A) NEGATIVE Final    Comment:        The GeneXpert MRSA Assay (FDA approved for NASAL specimens only), is one component of a comprehensive MRSA colonization surveillance program. It is not intended to diagnose MRSA infection nor to guide or monitor treatment for MRSA infections. RESULT CALLED TO, READ BACK BY AND VERIFIED WITHSalena Saner [facility] RN 1610 08/29/16 A NAVARRO      Radiological Exams on Admission: Ct Head Wo Contrast  Result Date: 08/28/2016 CLINICAL DATA:  Status post fall, with injury at the right zygoma. Hyperglycemia. Initial  encounter. EXAM: CT HEAD WITHOUT CONTRAST TECHNIQUE: Contiguous axial images were obtained from the base of the skull through the vertex without intravenous contrast. COMPARISON:  None. FINDINGS: Brain: No evidence of acute infarction, hemorrhage, hydrocephalus, extra-axial collection or mass lesion/mass effect. Prominence of the sulci suggests mild cortical volume loss. Mild cerebellar atrophy is noted. The brainstem and fourth ventricle are within normal limits. The basal ganglia are unremarkable in appearance. The cerebral hemispheres demonstrate grossly normal gray-white differentiation. No mass effect or midline shift is seen. Vascular: No hyperdense vessel or unexpected calcification. Skull: There is no evidence of fracture; visualized osseous structures are unremarkable in appearance. Sinuses/Orbits: The orbits are within normal limits. The paranasal sinuses and mastoid air cells are well-aerated. Other: Mild soft tissue swelling is noted overlying the right zygomatic arch. IMPRESSION: 1. No evidence of traumatic intracranial injury or fracture. 2. Mild soft tissue swelling overlying the right zygomatic arch. 3. Mild cortical volume loss noted. Electronically Signed   By: Roanna Raider M.D.   On: 08/28/2016 23:59    EKG: Independently reviewed. Sinus tachycardia, nonspecific T-wave changes    Time spent:60 minutes Code Status:   FULL Family Communication:  No Family at bedside Disposition Plan: expect 2-3 day hospitalization Consults called: psychiatry DVT Prophylaxis: [place] Lovenox/Heparin   SCDs  Jennylee Uehara, DO  [facility] Pager [phone]  If 7PM-7AM, please contact night-coverage [email or web address] Password [email or web address] 08/29/2016, 5:50 PM

## 2016-08-29 NOTE — Progress Notes (Signed)
  Echocardiogram 2D Echocardiogram has been performed.  Arvil Chaco 08/29/2016, 10:56 AM

## 2016-08-29 NOTE — Progress Notes (Signed)
Pharmacy IV to PO conversion  The patient is ordered Thiamine by the intravenous route.  Based on criteria approved by the Pharmacy and Therapeutics Committee and the Medical Executive Committee, the IV linked option is being discontinued.   No active GI bleeding or impaired absorption  Not s/p esophagectomy  Documented ability to take oral medications for > 24 hr  Plan to continue treatment for at least 1 day  If you have any questions about this conversion, please contact the Pharmacy Department (ext [phone]).  Thank you.  Bernadene Person, PharmD Pager: [phone] 08/29/2016, 9:47 AM

## 2016-08-29 NOTE — Care Management Note (Signed)
Case Management Note  Patient Details  Name: Adrian Gonzalez MRN: [id number] Date of Birth: May 10, 1966  Subjective/Objective:                 Medical clearance/suicidal thoughts   Action/Plan:Date:  August 29, 2016 Chart reviewed for concurrent status and case management needs. Will continue to follow patient progress. Discharge Planning: following for needs Expected discharge date: [id number] Marcelle Smiling, BSN, [place], [place]   [phone]   Expected Discharge Date:   (unknown)               Expected Discharge Plan:  Home/Self Care  In-House Referral:     Discharge planning Services     Post Acute Care Choice:    Choice offered to:     DME Arranged:    DME Agency:     HH Arranged:    HH Agency:     Status of Service:  In process, will continue to follow  If discussed at Long Length of Stay Meetings, dates discussed:    Additional Comments:  Golda Acre, RN 08/29/2016, 9:43 AM

## 2016-08-29 NOTE — Progress Notes (Addendum)
PROGRESS NOTE  Adrian Gonzalez VQQ:[id number] DOB: 1965-11-21 DOA: 08/28/2016 PCP: No PCP Per Patient  Brief History:  51 y.o. male with medical history of diabetes mellitus presented to the emergency department seeking help for his depression and anxiety. The patient has had previous thoughts of suicide, but currently does not have any active suicidal ideation or plan. Upon further history given to the ER physician, the patient has been complaining of some dizziness with polydipsia and polyuria.  As a result, the patient was placed on telemetry and further workup was obtained. The patient was noted to be tachycardic into 140-150s. The patient states that he has run out of his diabetic medications over 1 week ago. He states he is having some financial difficulties. In addition, his dizziness has resulted in him falling and hitting his face on the evening of 08/27/2016. The patient denied loss of consciousness  In the emergency department, the patient was noted to be tachycardic in the 140s. He was hemodynamically stable and saturating 98% on room air. BMP showed potassium 5.2, bicarbonate 18 with anion gap of 12. Hepatic enzymes were unremarkable. WBC was 10.0. Hemoglobin 16.8. Alcohol level was undetectable. EKG shows sinus tachycardia with nonspecific T-wave changes. Urine drug screen was negative. A VBG showed pH 7.43, but UA showed ketonuria.  Assessment/Plan: DKA type 2 -patient started on IV insulin with q 1 hour CBG check and q 4 hour BMPs -pt started on aggressive fluid resuscitation -Electrolytes were monitored and repleted -transitioned to [place] insulin once anion gap closed -diet advancement once anion gap closed -HbA1C-pending -increase lantus to 35 units q hs -add novolog 5 units with meals  Sinus tachycardia -due to DKA/dehydration -Initial concern about Etoh withdraw -CIWA -08/29/16--Echo--EF 55-60%, grade 1 DD, No WMA -TSH/FreeT4--consistent with euthyroid  sick--recheck in 4 weeks  Face injury -secondary to mechanical fall -CT brain--neg  Essential Hypertension -start losartan  daily  Diarrhea -resolved -no BM since admission -Check C. Difficile--cancel -Stool pathogen panel--cancel  Hyperkalemia -improvement with IV fluid resuscitation  "Heartburn"/atypical chest discomfort -GI cocktail--resolved -Personally reviewed EKG--sinus tachycardia with nonspecific T-wave change -Echocardiogram--EF 55-60%, grade 1 DD, No WMA -Cycle troponins--neg -Urine drug screen negative  Depression -has passive SI -I have already consulted psychiatry--will see 4/17    Disposition Plan:   Home vs [facility] : [facility]  On 4/18 if stable Family Communication:  No Family at bedside  Consultants:  psychiatry  Code Status:  FULL   DVT Prophylaxis:  [place] Lovenox   Procedures: As Listed in Progress Note Above  Antibiotics: None    Subjective: Patient denies fevers, chills, headache, chest pain, dyspnea, nausea, vomiting, diarrhea, abdominal pain, dysuria, hematuria, hematochezia, and melena.   Objective: Vitals:   08/29/16 0330 08/29/16 0557 08/29/16 0700 08/29/16 0814  BP: (!) 155/89   (!) 149/96  Pulse:  90    Resp: 17   16  Temp: 98.7 F (37.1 C)  98.7 F (37.1 C)   TempSrc: Oral  Oral   SpO2: 97%   98%  Weight:      Height:        Intake/Output Summary (Last 24 hours) at 08/29/16 1225 Last data filed at 08/29/16 0400  Gross per 24 hour  Intake          1675.42 ml  Output              275 ml  Net          1400.42  ml   Weight change:  Exam:   General:  Pt is alert, follows commands appropriately, not in acute distress  HEENT: No icterus, No thrush, No neck mass, [place]/AT  Cardiovascular: RRR, S1/S2, no rubs, no gallops  Respiratory: CTA bilaterally, no wheezing, no crackles, no rhonchi  Abdomen: Soft/+BS, non tender, non distended, no guarding  Extremities: No edema, No lymphangitis, No petechiae, No rashes, no  synovitis   Data Reviewed: I have personally reviewed following labs and imaging studies Basic Metabolic Panel:  Recent Labs Lab 08/28/16 1213 08/28/16 1411 08/28/16 1548 08/28/16 2205  NA 133* 136 135 135  K 5.2* 5.4* 4.0 3.6  CL 103 103 106 104  CO2 18*  --  21* 23  GLUCOSE 338* 382* 326* 375*  BUN CREATININE 0.90 0.80 0.99 0.91  CALCIUM 9.2  --  8.9 9.1   Liver Function Tests:  Recent Labs Lab 08/28/16 1213  AST 28  ALT 21  ALKPHOS 82  BILITOT 2.1*  PROT 7.9  ALBUMIN 4.7   No results for input(s): LIPASE, AMYLASE in the last 168 hours. No results for input(s): AMMONIA in the last 168 hours. Coagulation Profile: No results for input(s): INR, PROTIME in the last 168 hours. CBC:  Recent Labs Lab 08/28/16 1213 08/28/16 1411  WBC 10.0  --   HGB 16.8 15.3  HCT 46.6 45.0  MCV 84.0  --   PLT 228  --    Cardiac Enzymes:  Recent Labs Lab 08/28/16 2205  TROPONINI <0.03   BNP: Invalid input(s): POCBNP CBG:  Recent Labs Lab 08/28/16 2037 08/28/16 2329 08/29/16 0332 08/29/16 0806 08/29/16 1112  GLUCAP 360* 315* 291* 227* 345*   HbA1C: No results for input(s): HGBA1C in the last 72 hours. Urine analysis:    Component Value Date/Time   COLORURINE YELLOW 08/28/2016 1310   APPEARANCEUR CLEAR 08/28/2016 1310   LABSPEC 1.037 (H) 08/28/2016 1310   PHURINE 5.0 08/28/2016 1310   GLUCOSEU >=500 (A) 08/28/2016 1310   HGBUR NEGATIVE 08/28/2016 1310   BILIRUBINUR NEGATIVE 08/28/2016 1310   KETONESUR 20 (A) 08/28/2016 1310   PROTEINUR NEGATIVE 08/28/2016 1310   NITRITE NEGATIVE 08/28/2016 1310   LEUKOCYTESUR NEGATIVE 08/28/2016 1310   Sepsis Labs: (procalcitonin:4,lacticidven:4) ) Recent Results (from the past 240 hour(s))  MRSA PCR Screening     Status: Abnormal   Collection Time: 08/28/16 10:25 PM  Result Value Ref Range Status   MRSA by PCR POSITIVE (A) NEGATIVE Final    Comment:        The GeneXpert MRSA Assay  (FDA approved for NASAL specimens only), is one component of a comprehensive MRSA colonization surveillance program. It is not intended to diagnose MRSA infection nor to guide or monitor treatment for MRSA infections. RESULT CALLED TO, READ BACK BY AND VERIFIED WITHSalena Saner [facility] RN 1610 08/29/16 A NAVARRO      Scheduled Meds: . Chlorhexidine Gluconate Cloth  6 each Topical Q0600  . enoxaparin (LOVENOX) injection  40 mg Subcutaneous Q24H  . folic acid  1 mg Oral Daily  . gi cocktail  30 mL Oral Once  . insulin aspart  0-20 Units Subcutaneous TID WC  . insulin aspart  0-9 Units Subcutaneous QHS  . insulin glargine  20 Units Subcutaneous QHS  . LORazepam  0-4 mg Oral Q6H   Followed by  . [START ON 08/30/2016] LORazepam  0-4 mg Oral Q12H  . multivitamin with minerals  1 tablet Oral Daily  . mupirocin  ointment  1 application Nasal BID  . sodium chloride flush  3 mL Intravenous Q12H  . thiamine  100 mg Oral Daily   Continuous Infusions: . 0.9 % NaCl with KCl 20 mEq / L 125 mL/hr at 08/29/16 1212  . insulin (NOVOLIN-R) infusion      Procedures/Studies: Ct Head Wo Contrast  Result Date: 08/28/2016 CLINICAL DATA:  Status post fall, with injury at the right zygoma. Hyperglycemia. Initial encounter. EXAM: CT HEAD WITHOUT CONTRAST TECHNIQUE: Contiguous axial images were obtained from the base of the skull through the vertex without intravenous contrast. COMPARISON:  None. FINDINGS: Brain: No evidence of acute infarction, hemorrhage, hydrocephalus, extra-axial collection or mass lesion/mass effect. Prominence of the sulci suggests mild cortical volume loss. Mild cerebellar atrophy is noted. The brainstem and fourth ventricle are within normal limits. The basal ganglia are unremarkable in appearance. The cerebral hemispheres demonstrate grossly normal gray-white differentiation. No mass effect or midline shift is seen. Vascular: No hyperdense vessel or unexpected calcification. Skull: There is no  evidence of fracture; visualized osseous structures are unremarkable in appearance. Sinuses/Orbits: The orbits are within normal limits. The paranasal sinuses and mastoid air cells are well-aerated. Other: Mild soft tissue swelling is noted overlying the right zygomatic arch. IMPRESSION: 1. No evidence of traumatic intracranial injury or fracture. 2. Mild soft tissue swelling overlying the right zygomatic arch. 3. Mild cortical volume loss noted. Electronically Signed   By: Roanna Raider M.D.   On: 08/28/2016 23:59    Nechelle Petrizzo, DO  [facility] Pager [phone]  If 7PM-7AM, please contact night-coverage [email or web address] Password [email or web address] 08/29/2016, 12:25 PM   LOS: 1 day

## 2016-08-29 NOTE — Progress Notes (Signed)
MD paged about high BP and need for prn.

## 2016-08-29 NOTE — Clinical Social Work Psych Note (Signed)
Clinical Social Worker Psych Service Line Progress Note  Clinical Social Worker: Lia Hopping, LCSW Date/Time: 08/29/2016, 3:06 PM   Review of Patient  Overall Medical Condition:  Not Stable  Participation Level:  Active Participation Quality: Appropriate Other Participation Quality:  Pleasant, Tearful  Affect: Depressed Cognitive: Alert Reaction to Medications/Concerns: Patient is worried about not being able to afford   Modes of Intervention: Support, Exploration   Summary of Progress/Plan at Discharge  Summary of Progress/Plan at Discharge: CSW met with patient at bedside, explain role and reason for visit.  Patient agreeable to talk. Patient reports he has been feeling depressed and anxious because of life stressors. Patient reports his wife died two years ago and since then he feels hopeless. 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 The patient states he has a history of  drinking but quit after his wife became sick. He states a friend came into town and he had a few sips of alcohol with him.Patient states he lives at home with his mother and has difficulty finding a job because of his health issues. The patient express wanting help for his depression. The patient is agreeable to seek treatment at inpatient psychiatric hospital.  Plan: CSW will continue to assist with inpatient psychiatric placement.   Kathrin Greathouse, Latanya Presser, MSW Clinical Social Worker [place] [facility] Psychiatric Service Line [phone]

## 2016-08-29 NOTE — Progress Notes (Signed)
Patient admitted that in the past he had thoughts of harming himself. He denies any thoughts of harming himself at this time. We discussed his need to get himself healthier both mentally and physically. When asked what the most important thing was at this moment he stated "I want to get my kids back."

## 2016-08-29 NOTE — Consult Note (Signed)
[facility] Consult   Reason for Consult:  Depression and alcohol abuse Referring Physician:  Dr. Carles Collet Patient Identification: Adrian Gonzalez MRN:  [id number] Principal Diagnosis: MDD (major depressive disorder), recurrent severe, without psychosis ([place]) Diagnosis:   Patient Active Problem List   Diagnosis Date Noted  . Dehydration [E86.0] 08/28/2016  . DKA, type 2 ([place]) [E11.10] 08/28/2016  . Sinus tachycardia [R00.0] 08/28/2016  . Hyperkalemia [E87.5] 08/28/2016  . Diarrhea [R19.7] 08/28/2016  . Precordial chest pain [R07.2] 08/28/2016    Total Time spent with patient: 1 hour  Subjective:   Adrian Gonzalez is a 51 y.o. male patient admitted with depression and anxiety.  HPI:  [name] is an 51 y.o.Male, seen, chart reviewed and case discussed with LCSW for this face-to-face psychiatric consultation and evaluation of increased symptoms of depression and suicidal ideation. Patient reported he has been suffering with depressed mood, loss of interest, decreased motivation, decreased psychomotor activity, lack of focus and thoughts about harming himself. Patient family is concerned about his not able to care for himself and asking him to seek for treatment. Patient reportedly stopped receiving outpatient medication management for depression secondary to increased symptoms of depression and lack of adequate funds to [facility] medical care. Patient reportedly lost his wife for non-Hodgkin's lymphoma 15 years ago since then he lost his children custody while is not able to get appropriate mental health evaluation as recommended by the court. Patient reportedly disabled, cannot work and also lost his multimillion home and business over the last few years. Patient contract for safety while in the hospital. Patient ran out of his diabetic medication and presented with diabetic ketoacidosis and dehydration. Patient reportedly stopped drinking alcohol and recently had 2 sips of alcohol  when his friend visiting him from out of town.  He thinks about biting or cutting himself. No past history of attempts. Denies HI but admits to irritability. Denies A/V. The patient's father committed suicide by shooting and had issues with alcohol. The patient lives with his elderly mother who just received cancer treatment.   The patient reports multiple stressors: his wife died 3 yrs ago, and he lost custody of his twin girls 1.5 months ago. 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 States he drank last night, has a bruise on his right cheek after a fall. Denies drinking in past 2.5 yrs until last night. 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 Request to speak to a social worker about starting paperwork for disability.    Past Psychiatric History: Major depressive disorder and alcohol use disorder and no [facility] admissions. Patient was previously received antidepressant medication from primary care physician but discontinued secondary to unable to afford.  Risk to Self: Suicidal Ideation: No Suicidal Intent: No Is patient at risk for suicide?: No Suicidal Plan?: No Access to Means: No What has been your use of drugs/alcohol within the last 12 months?: periods of heavy alcohol use How many times?: 0 Other Self Harm Risks: 0 Intentional Self Injurious Behavior: None Risk to Others: Homicidal Ideation: No Thoughts of Harm to Others: Yes-Currently Present Comment - Thoughts of Harm to Others: thoughts to hit someone Current Homicidal Intent: No Current Homicidal Plan: No Access to Homicidal Means: No History of harm to others?: No Assessment of Violence: None Noted Does patient have access to weapons?: No Criminal Charges Pending?: Yes Describe Pending Criminal Charges: felony charges  Does patient have a court date: Yes Prior Inpatient Therapy: Prior Inpatient Therapy: No Prior Outpatient Therapy: Prior  Outpatient Therapy: No Does patient have an ACCT team?: No Does patient have Intensive In-House Services?  : No Does patient have [facility] services? : No Does patient have P4CC services?: No  Past Medical History:  Past Medical History:  Diagnosis Date  . Diabetes mellitus without complication ([place])   . Hypertension     Past Surgical History:  Procedure Laterality Date  . BACK SURGERY    . KNEE SURGERY     Family History: No family history on file. Family Psychiatric  History: Patient father was an alcoholic and his brother suffered with in and out of the incarcerated and also polysubstance abuse versus dependence Social History:  History  Alcohol Use No     History  Drug use: Unknown    Social History   Social History  . Marital status: Divorced    Spouse name: N/A  . Number of children: N/A  . Years of education: N/A   Social History Main Topics  . Smoking status: Never Smoker  . Smokeless tobacco: Current User    Types: Snuff  . Alcohol use No  . Drug use: Unknown  . Sexual activity: Not Asked   Other Topics Concern  . None   Social History Narrative  . None   Additional Social History:    Allergies:  No Known Allergies  Labs:  Results for orders placed or performed during the hospital encounter of 08/28/16 (from the past 48 hour(s))  CBG monitoring, ED     Status: Abnormal   Collection Time: 08/28/16 10:47 AM  Result Value Ref Range   Glucose-Capillary 328 (H) 65 - 99 mg/dL  TSH     Status: None   Collection Time: 08/28/16 12:12 PM  Result Value Ref Range   TSH 0.759 0.350 - 4.500 uIU/mL    Comment: Performed by a 3rd Generation assay with a functional sensitivity of <=0.01 uIU/mL.  Comprehensive metabolic panel     Status: Abnormal   Collection Time: 08/28/16 12:13 PM  Result Value Ref Range   Sodium 133 (L) 135 - 145 mmol/L   Potassium 5.2 (H) 3.5 - 5.1 mmol/L   Chloride 103 101 - 111 mmol/L   CO2 18 (L) 22 - 32 mmol/L   Glucose, Bld 338 (H)  65 - 99 mg/dL   BUN 9 6 - 20 mg/dL   Creatinine, Ser 0.90 0.61 - 1.24 mg/dL   Calcium 9.2 8.9 - 10.3 mg/dL   Total Protein 7.9 6.5 - 8.1 g/dL   Albumin 4.7 3.5 - 5.0 g/dL   AST 28 15 - 41 U/L   ALT 21 17 - 63 U/L   Alkaline Phosphatase 82 38 - 126 U/L   Total Bilirubin 2.1 (H) 0.3 - 1.2 mg/dL   GFR calc non Af Amer >60 >60 mL/min   GFR calc Af Amer >60 >60 mL/min    Comment: (NOTE) The eGFR has been calculated using the CKD EPI equation. This calculation has not been validated in all clinical situations. eGFR's persistently <60 mL/min signify possible Chronic Kidney Disease.    Anion gap 12 5 - 15  Ethanol     Status: None   Collection Time: 08/28/16 12:13 PM  Result Value Ref Range   Alcohol, Ethyl (B) <5 <5 mg/dL    Comment:        LOWEST DETECTABLE LIMIT FOR SERUM ALCOHOL IS 5 mg/dL FOR  MEDICAL PURPOSES ONLY   cbc     Status: Abnormal   Collection Time: 08/28/16 12:13 PM  Result Value Ref Range   WBC 10.0 4.0 - 10.5 K/uL   RBC 5.55 4.22 - 5.81 MIL/uL   Hemoglobin 16.8 13.0 - 17.0 g/dL   HCT 46.6 39.0 - 52.0 %   MCV 84.0 78.0 - 100.0 fL   MCH 30.3 26.0 - 34.0 pg   MCHC 36.1 (H) 30.0 - 36.0 g/dL   RDW 12.9 11.5 - 15.5 %   Platelets 228 150 - 400 K/uL  Blood gas, venous     Status: Abnormal   Collection Time: 08/28/16 12:27 PM  Result Value Ref Range   pH, Ven 7.439 (H) 7.250 - 7.430   pCO2, Ven 30.7 (L) 44.0 - 60.0 mmHg   pO2, Ven 72.7 (H) 32.0 - 45.0 mmHg   Bicarbonate 20.5 20.0 - 28.0 mmol/L   Acid-base deficit 2.0 0.0 - 2.0 mmol/L   O2 Saturation 95.0 %   Patient temperature 98.6    Collection site DRAWN BY RN    Drawn by DRAWN BY RN    Sample type VEIN   Rapid urine drug screen (hospital performed)     Status: None   Collection Time: 08/28/16  1:10 PM  Result Value Ref Range   Opiates NONE DETECTED NONE DETECTED   Cocaine NONE DETECTED NONE DETECTED   Benzodiazepines NONE DETECTED NONE DETECTED   Amphetamines NONE DETECTED NONE DETECTED    Tetrahydrocannabinol NONE DETECTED NONE DETECTED   Barbiturates NONE DETECTED NONE DETECTED    Comment:        DRUG SCREEN FOR MEDICAL PURPOSES ONLY.  IF CONFIRMATION IS NEEDED FOR ANY PURPOSE, NOTIFY LAB WITHIN 5 DAYS.        LOWEST DETECTABLE LIMITS FOR URINE DRUG SCREEN Drug Class       Cutoff (ng/mL) Amphetamine      1000 Barbiturate      200 Benzodiazepine   270 Tricyclics       350 Opiates          300 Cocaine          300 THC              50   Urinalysis, Routine w reflex microscopic     Status: Abnormal   Collection Time: 08/28/16  1:10 PM  Result Value Ref Range   Color, Urine YELLOW YELLOW   APPearance CLEAR CLEAR   Specific Gravity, Urine 1.037 (H) 1.005 - 1.030   pH 5.0 5.0 - 8.0   Glucose, UA >=500 (A) NEGATIVE mg/dL   Hgb urine dipstick NEGATIVE NEGATIVE   Bilirubin Urine NEGATIVE NEGATIVE   Ketones, ur 20 (A) NEGATIVE mg/dL   Protein, ur NEGATIVE NEGATIVE mg/dL   Nitrite NEGATIVE NEGATIVE   Leukocytes, UA NEGATIVE NEGATIVE   RBC / HPF 0-5 0 - 5 RBC/hpf   WBC, UA 6-30 0 - 5 WBC/hpf   Bacteria, UA RARE (A) NONE SEEN   Squamous Epithelial / LPF 0-5 (A) NONE SEEN   Mucous PRESENT    Hyaline Casts, UA PRESENT   I-stat chem 8, ed     Status: Abnormal   Collection Time: 08/28/16  2:11 PM  Result Value Ref Range   Sodium 136 135 - 145 mmol/L   Potassium 5.4 (H) 3.5 - 5.1 mmol/L   Chloride 103 101 - 111 mmol/L   BUN 10 6 - 20 mg/dL   Creatinine, Ser 0.80 0.61 - 1.24  mg/dL   Glucose, Bld 382 (H) 65 - 99 mg/dL   Calcium, Ion 1.08 (L) 1.15 - 1.40 mmol/L   TCO2 24 0 - 100 mmol/L   Hemoglobin 15.3 13.0 - 17.0 g/dL   HCT 45.0 39.0 - 52.0 %  CBG monitoring, ED     Status: Abnormal   Collection Time: 08/28/16  2:28 PM  Result Value Ref Range   Glucose-Capillary 361 (H) 65 - 99 mg/dL  POC CBG, ED     Status: Abnormal   Collection Time: 08/28/16  3:40 PM  Result Value Ref Range   Glucose-Capillary 306 (H) 65 - 99 mg/dL  Basic metabolic panel     Status:  Abnormal   Collection Time: 08/28/16  3:48 PM  Result Value Ref Range   Sodium 135 135 - 145 mmol/L   Potassium 4.0 3.5 - 5.1 mmol/L    Comment: DELTA CHECK NOTED REPEATED TO VERIFY    Chloride 106 101 - 111 mmol/L   CO2 21 (L) 22 - 32 mmol/L   Glucose, Bld 326 (H) 65 - 99 mg/dL   BUN 13 6 - 20 mg/dL   Creatinine, Ser 0.99 0.61 - 1.24 mg/dL   Calcium 8.9 8.9 - 10.3 mg/dL   GFR calc non Af Amer >60 >60 mL/min   GFR calc Af Amer >60 >60 mL/min    Comment: (NOTE) The eGFR has been calculated using the CKD EPI equation. This calculation has not been validated in all clinical situations. eGFR's persistently <60 mL/min signify possible Chronic Kidney Disease.    Anion gap 8 5 - 15  CBG monitoring, ED     Status: Abnormal   Collection Time: 08/28/16  8:37 PM  Result Value Ref Range   Glucose-Capillary 360 (H) 65 - 99 mg/dL  TSH     Status: None   Collection Time: 08/28/16 10:05 PM  Result Value Ref Range   TSH 0.998 0.350 - 4.500 uIU/mL    Comment: Performed by a 3rd Generation assay with a functional sensitivity of <=0.01 uIU/mL.  Troponin I (q 6hr x 3)     Status: None   Collection Time: 08/28/16 10:05 PM  Result Value Ref Range   Troponin I <0.03 <0.03 ng/mL  T4, free     Status: Abnormal   Collection Time: 08/28/16 10:05 PM  Result Value Ref Range   Free T4 1.18 (H) 0.61 - 1.12 ng/dL    Comment: (NOTE) Biotin ingestion may interfere with free T4 tests. If the results are inconsistent with the TSH level, previous test results, or the clinical presentation, then consider biotin interference. If needed, order repeat testing after stopping biotin. Performed at [facility], [place] [address], [place], [place] [address]   Basic metabolic panel     Status: Abnormal   Collection Time: 08/28/16 10:05 PM  Result Value Ref Range   Sodium 135 135 - 145 mmol/L   Potassium 3.6 3.5 - 5.1 mmol/L   Chloride 104 101 - 111 mmol/L   CO2 23 22 - 32 mmol/L   Glucose, Bld 375 (H) 65  - 99 mg/dL   BUN 15 6 - 20 mg/dL   Creatinine, Ser 0.91 0.61 - 1.24 mg/dL   Calcium 9.1 8.9 - 10.3 mg/dL   GFR calc non Af Amer >60 >60 mL/min   GFR calc Af Amer >60 >60 mL/min    Comment: (NOTE) The eGFR has been calculated using the CKD EPI equation. This calculation has not been validated in  all clinical situations. eGFR's persistently <60 mL/min signify possible Chronic Kidney Disease.    Anion gap 8 5 - 15  MRSA PCR Screening     Status: Abnormal   Collection Time: 08/28/16 10:25 PM  Result Value Ref Range   MRSA by PCR POSITIVE (A) NEGATIVE    Comment:        The GeneXpert MRSA Assay (FDA approved for NASAL specimens only), is one component of a comprehensive MRSA colonization surveillance program. It is not intended to diagnose MRSA infection nor to guide or monitor treatment for MRSA infections. RESULT CALLED TO, READ BACK BY AND VERIFIED WITHLoletha Grayer [facility] RN 3500 08/29/16 A NAVARRO   Glucose, capillary     Status: Abnormal   Collection Time: 08/28/16 11:29 PM  Result Value Ref Range   Glucose-Capillary 315 (H) 65 - 99 mg/dL  Glucose, capillary     Status: Abnormal   Collection Time: 08/29/16  3:32 AM  Result Value Ref Range   Glucose-Capillary 291 (H) 65 - 99 mg/dL  Glucose, capillary     Status: Abnormal   Collection Time: 08/29/16  8:06 AM  Result Value Ref Range   Glucose-Capillary 227 (H) 65 - 99 mg/dL  Glucose, capillary     Status: Abnormal   Collection Time: 08/29/16 11:12 AM  Result Value Ref Range   Glucose-Capillary 345 (H) 65 - 99 mg/dL    Current Facility-Administered Medications  Medication Dose Route Frequency Provider Last Rate Last Dose  . acetaminophen (TYLENOL) tablet 650 mg  650 mg Oral Q6H PRN Orson Eva, MD   650 mg at 08/28/16 2355   Or  . acetaminophen (TYLENOL) suppository 650 mg  650 mg Rectal Q6H PRN Orson Eva, MD      . Chlorhexidine Gluconate Cloth 2 % PADS 6 each  6 each Topical Q0600 Orson Eva, MD   6 each at 08/29/16 1000  .  enoxaparin (LOVENOX) injection 40 mg  40 mg Subcutaneous Q24H Orson Eva, MD   40 mg at 08/28/16 2329  . folic acid (FOLVITE) tablet 1 mg  1 mg Oral Daily Orson Eva, MD   1 mg at 08/29/16 0808  . gi cocktail (Maalox,Lidocaine,Donnatal)  30 mL Oral Once Shanon Brow Tat, MD      . insulin aspart (novoLOG) injection 0-20 Units  0-20 Units Subcutaneous TID WC Gardiner Barefoot, NP   15 Units at 08/29/16 1113  . insulin aspart (novoLOG) injection 0-9 Units  0-9 Units Subcutaneous QHS Gardiner Barefoot, NP   7 Units at 08/28/16 2358  . insulin glargine (LANTUS) injection 20 Units  20 Units Subcutaneous QHS Gardiner Barefoot, NP   20 Units at 08/28/16 2355  . insulin regular (NOVOLIN R,HUMULIN R) 250 Units in sodium chloride 0.9 % 250 mL (1 Units/mL) infusion   Intravenous Continuous Gardiner Barefoot, NP      . LORazepam (ATIVAN) tablet 1 mg  1 mg Oral Q6H PRN Orson Eva, MD       Or  . LORazepam (ATIVAN) injection 1 mg  1 mg Intravenous Q6H PRN Orson Eva, MD      . LORazepam (ATIVAN) tablet 0-4 mg  0-4 mg Oral Q6H Lacretia Leigh, MD   1 mg at 08/29/16 1109   Followed by  . [START ON 08/30/2016] LORazepam (ATIVAN) tablet 0-4 mg  0-4 mg Oral Q12H Lacretia Leigh, MD      . multivitamin with minerals tablet 1 tablet  1 tablet Oral Daily Orson Eva, MD  1 tablet at 08/29/16 0808  . mupirocin ointment (BACTROBAN) 2 % 1 application  1 application Nasal BID Orson Eva, MD   1 application at 28/00/34 0809  . ondansetron (ZOFRAN) tablet 4 mg  4 mg Oral Q6H PRN Orson Eva, MD       Or  . ondansetron [facility]) injection 4 mg  4 mg Intravenous Q6H PRN Orson Eva, MD      . sodium chloride 0.9 % 1,000 mL with potassium chloride 20 mEq infusion   Intravenous Continuous David Tat, MD      . sodium chloride flush (NS) 0.9 % injection 3 mL  3 mL Intravenous Q12H David Tat, MD      . thiamine (VITAMIN B-1) tablet 100 mg  100 mg Oral Daily Orson Eva, MD   100 mg at 08/29/16 0808    Musculoskeletal: Strength & Muscle  Tone: decreased Gait & Station: unable to stand Patient leans: N/A  Psychiatric Specialty Exam: Physical Exam as per history and physical   ROS generalized weakness, dehydration, unable to care for himself depression, anxiety, dysphoric, loss of motivation and interest and thoughts about harming himself and has significant losses.  No Fever-chills, No Headache, No changes with Vision or hearing, reports vertigo No problems swallowing food or Liquids, No Chest pain, Cough or Shortness of Breath, No Abdominal pain, No Nausea or Vommitting, Bowel movements are regular, No Blood in stool or Urine, No dysuria, No new skin rashes or bruises, No new joints pains-aches,  No new weakness, tingling, numbness in any extremity, No recent weight gain or loss, No polyuria, polydypsia or polyphagia,  A full 10 point Review of Systems was done, except as stated above, all other Review of Systems were negative.  Blood pressure (!) 149/96, pulse 90, temperature 98.7 F (37.1 C), temperature source Oral, resp. rate 16, height 6' 4"  (1.93 m), weight 128.1 kg (282 lb 6.6 oz), SpO2 98 %.Body mass index is 34.38 kg/m.  General Appearance: Disheveled  Eye Contact:  Good  Speech:  Clear and Coherent  Volume:  Decreased  Mood:  Anxious, Dysphoric, Hopeless and Worthless  Affect:  Depressed and Tearful  Thought Process:  Coherent and Goal Directed  Orientation:  Full (Time, Place, and Person)  Thought Content:  WDL  Suicidal Thoughts:  Yes.  without intent/plan  Homicidal Thoughts:  No  Memory:  Immediate;   Good Recent;   Fair Remote;   Good  Judgement:  Impaired  Insight:  Good  Psychomotor Activity:  Decreased and Restlessness  Concentration:  Concentration: Good and Attention Span: Fair  Recall:  Good  Fund of Knowledge:  Good  Language:  Good  Akathisia:  Negative  Handed:  Right  AIMS (if indicated):     Assets:  Communication Skills Desire for Improvement Housing Leisure Time Social  Support  ADL's:  Impaired  Cognition:  WNL  Sleep:        Treatment Plan Summary: 51 years old male with depression, alcohol abuse, recent relapse diabetes mellitus presented with diabetic ketoacidosis and dehydration and also thoughts about cutting himself. Patient is severely depressed secondary to not able to get treatment.  Will start Fluoxetine 20 mg daily and remeron 15 mg at bed time. Patient contracts for safety while in the hospital Patient meets criteria for acute psychiatric hospitalization when medically stable. Refer tp unit social service to contact behavioral [place] when medically stable. Daily contact with patient to assess and evaluate symptoms and progress in treatment and Medication  management  Disposition: Recommend psychiatric Inpatient admission when medically cleared. Supportive therapy provided about ongoing stressors.  Ambrose Finland, MD 08/29/2016 11:30 AM

## 2016-08-30 DIAGNOSIS — R000 Tachycardia, unspecified: Secondary | ICD-10-CM

## 2016-08-30 DIAGNOSIS — E875 Hyperkalemia: Secondary | ICD-10-CM

## 2016-08-30 DIAGNOSIS — E1110 Type 2 diabetes mellitus with ketoacidosis without coma: Secondary | ICD-10-CM

## 2016-08-30 DIAGNOSIS — Z794 Long term (current) use of insulin: Secondary | ICD-10-CM

## 2016-08-30 DIAGNOSIS — F332 Major depressive disorder, recurrent severe without psychotic features: Principal | ICD-10-CM

## 2016-08-30 LAB — HEMOGLOBIN A1C
Hgb A1c MFr Bld: 10.1 % — ABNORMAL HIGH (ref 4.8–5.6)
Mean Plasma Glucose: 243 mg/dL

## 2016-08-30 LAB — GLUCOSE, CAPILLARY
GLUCOSE-CAPILLARY: 244 mg/dL — AB (ref 65–99)
Glucose-Capillary: 157 mg/dL — ABNORMAL HIGH (ref 65–99)
Glucose-Capillary: 313 mg/dL — ABNORMAL HIGH (ref 65–99)

## 2016-08-30 LAB — BASIC METABOLIC PANEL
ANION GAP: 6 (ref 5–15)
BUN: 11 mg/dL (ref 6–20)
CHLORIDE: 109 mmol/L (ref 101–111)
CO2: 23 mmol/L (ref 22–32)
Calcium: 8.8 mg/dL — ABNORMAL LOW (ref 8.9–10.3)
Creatinine, Ser: 0.64 mg/dL (ref 0.61–1.24)
GFR calc Af Amer: >60 mL/min (ref >60)
GLUCOSE: 154 mg/dL — AB (ref 65–99)
POTASSIUM: 3.4 mmol/L — AB (ref 3.5–5.1)
Sodium: 138 mmol/L (ref 135–145)

## 2016-08-30 LAB — MAGNESIUM: Magnesium: 1.8 mg/dL (ref 1.7–2.4)

## 2016-08-30 MED ORDER — ZOLPIDEM TARTRATE 5 MG PO TABS: 5.0000 mg | ORAL_TABLET | Freq: Every evening | ORAL | Status: DC | PRN

## 2016-08-30 NOTE — Progress Notes (Signed)
Inpatient Diabetes Program Recommendations  AACE/ADA: New Consensus Statement on Inpatient Glycemic Control (2015)  Target Ranges:  Prepandial:   less than 140 mg/dL      Peak postprandial:   less than 180 mg/dL (1-2 hours)      Critically ill patients:  140 - 180 mg/dL   Lab Results  Component Value Date   GLUCAP 157 (H) 08/30/2016   HGBA1C 10.1 (H) 08/28/2016    Review of Glycemic Control  Blood sugars trending well today.   Inpatient Diabetes Program Recommendations:    Agree with Lantus 35 units QHS and addition of Novolog 5 units tidwc.  Will be discharged to [facility] when medically stable. Will need affordable insulin for home.  Will continue to follow.  Thank you. Ailene Ards, RD, LDN, CDE Inpatient Diabetes Coordinator [phone]

## 2016-08-30 NOTE — Progress Notes (Signed)
PROGRESS NOTE  Adrian Gonzalez:[id number] DOB: [birth year] DOA: 08/28/2016 PCP: No PCP Per Patient  HPI/Recap of past 6 hours: 51 year old male past history of diabetes mellitus and depression and anxiety admitted on 4/16 for uncontrolled diabetes mellitus with early signs of ketoacidosis as well as severe depression. Patient had run out of his medications a week prior. Ketoacidosis resolved by 4/17. Patient now felt to be medically cleared. Seen by psychiatry who recommended inpatient treatment of his depression.  Today, patient with no complaints. Denies any pain.  Assessment/Plan: Principal Problem:   MDD (major depressive disorder), recurrent severe, without psychosis (HCC): Awaiting bed at behavioral health. Active Problems:   Dehydration   DKA, type 2 (HCC) from uncontrolled diabetes mellitus with complication: DKA itself resolved. Appreciate diabetes coordinator. Currently on Lantus plus NovoLog 3 times a day. Upon discharge, would change over to insulin 70/30 to be affordable   Sinus tachycardia, mild, in part from anxiety   Hyperkalemia: Secondary to hyperglycemia, resolved   Diarrhea: Looks to have resolved   Precordial chest pain   Code Status: Full code  Family Communication: No family present   Disposition Plan: Awaiting bed at behavioral health    Consultants:  Psychiatry   Procedures:  None   Antimicrobials:  None   DVT prophylaxis: Lovenox   Objective: Vitals:   08/30/16 0500 08/30/16 0600 08/30/16 0700 08/30/16 1120  BP:    (!) 144/76  Pulse:      Resp:  14  (!) 22  Temp:   97.7 F (36.5 C)   TempSrc:   Oral   SpO2: 94% 94%  98%  Weight:      Height:        Intake/Output Summary (Last 24 hours) at 08/30/16 1429 Last data filed at 08/30/16 1100  Gross per 24 hour  Intake             2850 ml  Output             2050 ml  Net              800 ml   Filed Weights   08/28/16 1102 08/28/16 2200  Weight: 127 kg (280 lb) 128.1 kg (282 lb  6.6 oz)    Exam:   General:  Alert and oriented 3, slightly flattened affect   Cardiovascular: Regular rate and rhythm, borderline tachycardia   Respiratory: Clear to auscultation bilaterally   Abdomen: Soft, nontender, nondistended, positive bowel sounds   Musculoskeletal: No clubbing or cyanosis, trace pitting edema   Skin: No skin breaks, tears or lesions  Psychiatry: Depressed, flattened affect    Data Reviewed: CBC:  Recent Labs Lab 08/28/16 1213 08/28/16 1411 08/29/16 1503  WBC 10.0  --  6.2  HGB 16.8 15.3 14.5  HCT 46.6 45.0 41.7  MCV 84.0  --  86.0  PLT 228  --  187   Basic Metabolic Panel:  Recent Labs Lab 08/28/16 1213 08/28/16 1411 08/28/16 1548 08/28/16 2205 08/29/16 1503 08/30/16 0324  NA 133* 136 135 135 135 138  K 5.2* 5.4* 4.0 3.6 3.9 3.4*  CL 103 103 106 104 104 109  CO2 18*  --  21* GLUCOSE 338* 382* 326* 375* 315* 154*  BUN CREATININE 0.90 0.80 0.99 0.91 0.84 0.64  CALCIUM 9.2  --  8.9 9.1 8.9 8.8*  MG  --   --   --   --   --  1.8   GFR: Estimated Creatinine Clearance: 161.4 mL/min (by C-G formula based on SCr of 0.64 mg/dL). Liver Function Tests:  Recent Labs Lab 08/28/16 1213  AST 28  ALT 21  ALKPHOS 82  BILITOT 2.1*  PROT 7.9  ALBUMIN 4.7   No results for input(s): LIPASE, AMYLASE in the last 168 hours. No results for input(s): AMMONIA in the last 168 hours. Coagulation Profile: No results for input(s): INR, PROTIME in the last 168 hours. Cardiac Enzymes:  Recent Labs Lab 08/28/16 2205 08/29/16 1503  TROPONINI <0.03 <0.03   BNP (last 3 results) No results for input(s): PROBNP in the last 8760 hours. HbA1C:  Recent Labs  08/28/16 2205  HGBA1C 10.1*   CBG:  Recent Labs Lab 08/29/16 1112 08/29/16 1629 08/29/16 2131 08/30/16 0809 08/30/16 1310  GLUCAP 345* 346* 239* 157* 244*   Lipid Profile: No results for input(s): CHOL, HDL, LDLCALC, TRIG, CHOLHDL, LDLDIRECT in the  last 72 hours. Thyroid Function Tests:  Recent Labs  08/28/16 2205  TSH 0.998  FREET4 1.18*   Anemia Panel: No results for input(s): VITAMINB12, FOLATE, FERRITIN, TIBC, IRON, RETICCTPCT in the last 72 hours. Urine analysis:    Component Value Date/Time   COLORURINE YELLOW 08/28/2016 1310   APPEARANCEUR CLEAR 08/28/2016 1310   LABSPEC 1.037 (H) 08/28/2016 1310   PHURINE 5.0 08/28/2016 1310   GLUCOSEU >=500 (A) 08/28/2016 1310   HGBUR NEGATIVE 08/28/2016 1310   BILIRUBINUR NEGATIVE 08/28/2016 1310   KETONESUR 20 (A) 08/28/2016 1310   PROTEINUR NEGATIVE 08/28/2016 1310   NITRITE NEGATIVE 08/28/2016 1310   LEUKOCYTESUR NEGATIVE 08/28/2016 1310   Sepsis Labs: (procalcitonin:4,lacticidven:4)  ) Recent Results (from the past 240 hour(s))  Culture, Urine     Status: Abnormal (Preliminary result)   Collection Time: 08/28/16  1:10 PM  Result Value Ref Range Status   Specimen Description URINE, RANDOM  Final   Special Requests NONE  Final   Culture (A)  Final    >=100,000 COLONIES/mL STAPHYLOCOCCUS AUREUS SUSCEPTIBILITIES TO FOLLOW Performed at [facility], [address], [place], [place] [address]    Report Status PENDING  Incomplete  MRSA PCR Screening     Status: Abnormal   Collection Time: 08/28/16 10:25 PM  Result Value Ref Range Status   MRSA by PCR POSITIVE (A) NEGATIVE Final    Comment:        The GeneXpert MRSA Assay (FDA approved for NASAL specimens only), is one component of a comprehensive MRSA colonization surveillance program. It is not intended to diagnose MRSA infection nor to guide or monitor treatment for MRSA infections. RESULT CALLED TO, READ BACK BY AND VERIFIED WITHSalena Saner [facility] RN [id number] 08/29/16 A NAVARRO       Studies: No results found.  Scheduled Meds: . Chlorhexidine Gluconate Cloth  6 each Topical Q0600  . enoxaparin (LOVENOX) injection  40 mg Subcutaneous Q24H  . FLUoxetine  20 mg Oral Daily  . folic acid  1 mg Oral  Daily  . gi cocktail  30 mL Oral Once  . insulin aspart  0-20 Units Subcutaneous TID WC  . insulin aspart  0-9 Units Subcutaneous QHS  . insulin aspart  5 Units Subcutaneous TID WC  . insulin glargine  35 Units Subcutaneous QHS  . LORazepam  0-4 mg Oral Q6H   Followed by  . LORazepam  0-4 mg Oral Q12H  . losartan  25 mg Oral Daily  . mirtazapine  15 mg Oral QHS  . multivitamin with  minerals  1 tablet Oral Daily  . mupirocin ointment  1 application Nasal BID  . sodium chloride flush  3 mL Intravenous Q12H  . thiamine  100 mg Oral Daily    Continuous Infusions:   LOS: 2 days    Hollice Espy, MD [facility] Pager [phone]  If 7PM-7AM, please contact night-coverage [email or web address] Password [facility] 08/30/2016, 2:29 PM

## 2016-08-31 ENCOUNTER — Inpatient Hospital Stay (HOSPITAL_COMMUNITY)
Admission: AD | Admit: 2016-08-31 | Discharge: 2016-09-06 | DRG: 885 | Disposition: A | Discharge status: Home / Self Care | Payer: No Typology Code available for payment source | Source: Intra-hospital | Attending: Psychiatry | Admitting: Psychiatry

## 2016-08-31 ENCOUNTER — Encounter (HOSPITAL_COMMUNITY): Payer: Self-pay

## 2016-08-31 DIAGNOSIS — G4700 Insomnia, unspecified: Secondary | ICD-10-CM | POA: Diagnosis present

## 2016-08-31 DIAGNOSIS — F332 Major depressive disorder, recurrent severe without psychotic features: Principal | ICD-10-CM | POA: Diagnosis present

## 2016-08-31 DIAGNOSIS — E119 Type 2 diabetes mellitus without complications: Secondary | ICD-10-CM | POA: Diagnosis present

## 2016-08-31 DIAGNOSIS — E1110 Type 2 diabetes mellitus with ketoacidosis without coma: Secondary | ICD-10-CM | POA: Diagnosis not present

## 2016-08-31 DIAGNOSIS — E875 Hyperkalemia: Secondary | ICD-10-CM | POA: Diagnosis not present

## 2016-08-31 DIAGNOSIS — F17200 Nicotine dependence, unspecified, uncomplicated: Secondary | ICD-10-CM | POA: Diagnosis present

## 2016-08-31 DIAGNOSIS — M1990 Unspecified osteoarthritis, unspecified site: Secondary | ICD-10-CM | POA: Diagnosis present

## 2016-08-31 DIAGNOSIS — I10 Essential (primary) hypertension: Secondary | ICD-10-CM | POA: Diagnosis present

## 2016-08-31 DIAGNOSIS — Z818 Family history of other mental and behavioral disorders: Secondary | ICD-10-CM

## 2016-08-31 DIAGNOSIS — R000 Tachycardia, unspecified: Secondary | ICD-10-CM | POA: Diagnosis not present

## 2016-08-31 DIAGNOSIS — E860 Dehydration: Secondary | ICD-10-CM | POA: Diagnosis not present

## 2016-08-31 DIAGNOSIS — Z811 Family history of alcohol abuse and dependence: Secondary | ICD-10-CM | POA: Diagnosis not present

## 2016-08-31 DIAGNOSIS — R072 Precordial pain: Secondary | ICD-10-CM | POA: Diagnosis not present

## 2016-08-31 DIAGNOSIS — F411 Generalized anxiety disorder: Secondary | ICD-10-CM | POA: Diagnosis present

## 2016-08-31 DIAGNOSIS — Z813 Family history of other psychoactive substance abuse and dependence: Secondary | ICD-10-CM | POA: Diagnosis not present

## 2016-08-31 DIAGNOSIS — R197 Diarrhea, unspecified: Secondary | ICD-10-CM | POA: Diagnosis not present

## 2016-08-31 LAB — GLUCOSE, CAPILLARY
GLUCOSE-CAPILLARY: 240 mg/dL — AB (ref 65–99)
Glucose-Capillary: 271 mg/dL — ABNORMAL HIGH (ref 65–99)
Glucose-Capillary: 215 mg/dL — ABNORMAL HIGH (ref 65–99)
Glucose-Capillary: 287 mg/dL — ABNORMAL HIGH (ref 65–99)
Glucose-Capillary: 319 mg/dL — ABNORMAL HIGH (ref 65–99)

## 2016-08-31 LAB — URINE CULTURE

## 2016-08-31 MED ORDER — MIRTAZAPINE 15 MG PO TABS: 15.0000 mg | ORAL_TABLET | Freq: Every day | ORAL | 0 refills | Status: DC

## 2016-08-31 MED ORDER — INSULIN GLARGINE 100 UNIT/ML ~~LOC~~ SOLN
40.0000 [IU] | Freq: Every day | SUBCUTANEOUS | Status: DC
  Filled 2016-08-31: qty 0.4

## 2016-08-31 MED ORDER — INSULIN ASPART 100 UNIT/ML ~~LOC~~ SOLN: 0.0000 [IU] | Freq: Every day | SUBCUTANEOUS | 11 refills | Status: DC

## 2016-08-31 MED ORDER — HYDROXYZINE HCL 25 MG PO TABS
25.0000 mg | ORAL_TABLET | Freq: Four times a day (QID) | ORAL | Status: DC | PRN
  Administered 2016-08-31 – 2016-09-03 (×6): 25 mg via ORAL
  Filled 2016-08-31 (×4): qty 1
  Filled 2016-08-31: qty 10
  Filled 2016-08-31 (×2): qty 1

## 2016-08-31 MED ORDER — INSULIN ASPART 100 UNIT/ML ~~LOC~~ SOLN: 8.0000 [IU] | Freq: Three times a day (TID) | SUBCUTANEOUS | 11 refills | Status: DC

## 2016-08-31 MED ORDER — ALUM & MAG HYDROXIDE-SIMETH 200-200-20 MG/5ML PO SUSP: 30.0000 mL | ORAL | Status: DC | PRN

## 2016-08-31 MED ORDER — INSULIN ASPART 100 UNIT/ML ~~LOC~~ SOLN
0.0000 [IU] | Freq: Three times a day (TID) | SUBCUTANEOUS | Status: DC
  Administered 2016-08-31: 11 [IU] via SUBCUTANEOUS
  Administered 2016-09-01: 15 [IU] via SUBCUTANEOUS
  Administered 2016-09-01: 11 [IU] via SUBCUTANEOUS
  Administered 2016-09-01: 20 [IU] via SUBCUTANEOUS
  Administered 2016-09-02: 15 [IU] via SUBCUTANEOUS
  Administered 2016-09-02: 11 [IU] via SUBCUTANEOUS
  Administered 2016-09-02: 20 [IU] via SUBCUTANEOUS
  Administered 2016-09-03: 15 [IU] via SUBCUTANEOUS
  Administered 2016-09-03 (×2): 11 [IU] via SUBCUTANEOUS
  Administered 2016-09-04 (×2): 7 [IU] via SUBCUTANEOUS
  Administered 2016-09-04: 20 [IU] via SUBCUTANEOUS
  Administered 2016-09-05: 15 [IU] via SUBCUTANEOUS
  Administered 2016-09-05: 7 [IU] via SUBCUTANEOUS
  Administered 2016-09-05 – 2016-09-06 (×2): 11 [IU] via SUBCUTANEOUS

## 2016-08-31 MED ORDER — FLUOXETINE HCL 20 MG PO CAPS
20.0000 mg | ORAL_CAPSULE | Freq: Every day | ORAL | Status: DC
  Administered 2016-09-01: 20 mg via ORAL
  Filled 2016-08-31 (×2): qty 1

## 2016-08-31 MED ORDER — LOSARTAN POTASSIUM 25 MG PO TABS: 25.0000 mg | ORAL_TABLET | Freq: Every day | ORAL | 0 refills | Status: DC

## 2016-08-31 MED ORDER — ACETAMINOPHEN 325 MG PO TABS
650.0000 mg | ORAL_TABLET | Freq: Four times a day (QID) | ORAL | Status: DC | PRN
  Administered 2016-08-31 – 2016-09-05 (×8): 650 mg via ORAL
  Filled 2016-08-31 (×9): qty 2

## 2016-08-31 MED ORDER — INSULIN GLARGINE 100 UNIT/ML ~~LOC~~ SOLN
35.0000 [IU] | Freq: Every day | SUBCUTANEOUS | Status: DC
  Administered 2016-08-31 – 2016-09-05 (×6): 35 [IU] via SUBCUTANEOUS

## 2016-08-31 MED ORDER — INSULIN ASPART 100 UNIT/ML ~~LOC~~ SOLN
5.0000 [IU] | Freq: Three times a day (TID) | SUBCUTANEOUS | Status: DC
  Administered 2016-08-31 – 2016-09-06 (×17): 5 [IU] via SUBCUTANEOUS

## 2016-08-31 MED ORDER — INSULIN ASPART 100 UNIT/ML ~~LOC~~ SOLN: 8.0000 [IU] | Freq: Three times a day (TID) | SUBCUTANEOUS | Status: DC

## 2016-08-31 MED ORDER — ADULT MULTIVITAMIN W/MINERALS CH
1.0000 | ORAL_TABLET | Freq: Every day | ORAL | Status: DC
  Administered 2016-09-01 – 2016-09-06 (×6): 1 via ORAL
  Filled 2016-08-31 (×8): qty 1

## 2016-08-31 MED ORDER — FLUOXETINE HCL 20 MG PO CAPS: 20.0000 mg | ORAL_CAPSULE | Freq: Every day | ORAL | 3 refills | Status: DC

## 2016-08-31 MED ORDER — MIRTAZAPINE 15 MG PO TABS
15.0000 mg | ORAL_TABLET | Freq: Every day | ORAL | Status: DC
  Administered 2016-08-31: 15 mg via ORAL
  Filled 2016-08-31 (×3): qty 1

## 2016-08-31 MED ORDER — INSULIN GLARGINE 100 UNIT/ML ~~LOC~~ SOLN: 40.0000 [IU] | Freq: Every day | SUBCUTANEOUS | 11 refills | Status: DC

## 2016-08-31 MED ORDER — NICOTINE POLACRILEX 2 MG MT GUM
2.0000 mg | CHEWING_GUM | OROMUCOSAL | Status: DC | PRN
  Administered 2016-08-31 – 2016-09-06 (×17): 2 mg via ORAL
  Filled 2016-08-31: qty 8
  Filled 2016-08-31 (×2): qty 1

## 2016-08-31 MED ORDER — MAGNESIUM HYDROXIDE 400 MG/5ML PO SUSP: 30.0000 mL | Freq: Every day | ORAL | Status: DC | PRN

## 2016-08-31 MED ORDER — LOSARTAN POTASSIUM 25 MG PO TABS
25.0000 mg | ORAL_TABLET | Freq: Every day | ORAL | Status: DC
  Administered 2016-09-01 – 2016-09-06 (×6): 25 mg via ORAL
  Filled 2016-08-31: qty 1
  Filled 2016-08-31: qty 7
  Filled 2016-08-31 (×6): qty 1

## 2016-08-31 MED ORDER — INSULIN ASPART 100 UNIT/ML ~~LOC~~ SOLN: 0.0000 [IU] | Freq: Three times a day (TID) | SUBCUTANEOUS | 11 refills | Status: DC

## 2016-08-31 NOTE — Progress Notes (Signed)
Admission Note: Patient is an 51 year old male who is admitted to the unit for severe depression and suicidal thoughts.  Patient currently denies suicidal thoughts, auditory and visual hallucinations.  Patient is alert and oriented to person, place and time.  Patient presents with sad affect and depressed mood.  Patient states one of his stressors is working on getting his twins girls back.  Admission plan of care reviewed with patient and consent for treatment signed.  Skin assessment completed.  Abrasion noted on right knee, hand and face.   Rash around buttock.  Patient oriented to the unit, staff and room.  Safety checks initiated.  Support and encouragement offered as needed.  Patient is safe on the unit.  Tylenol 650 mg given for complain of back pain with good effect.

## 2016-08-31 NOTE — Tx Team (Signed)
Initial Treatment Plan 08/31/2016 3:03 PM JALEAL SCHLIEP WUJ:[id number]    PATIENT STRESSORS: Financial difficulties Health problems Legal issue Loss of significant other Substance abuse   PATIENT STRENGTHS: Ability for insight Average or above average intelligence Communication skills Supportive family/friends   PATIENT IDENTIFIED PROBLEMS: "getting my kids back"  Depression  Anxiety  Suicidality  Ineffective Coping skills             DISCHARGE CRITERIA:  Ability to meet basic life and health needs Adequate post-discharge living arrangements Safe-care adequate arrangements made Verbal commitment to aftercare and medication compliance  PRELIMINARY DISCHARGE PLAN: Attend aftercare/continuing care group Outpatient therapy Return to previous living arrangement  PATIENT/FAMILY INVOLVEMENT: This treatment plan has been presented to and reviewed with the patient, CORRIE BRANNEN, and/or family member.  The patient and family have been given the opportunity to ask questions and make suggestions.  Mickie Bail, RN 08/31/2016, 3:03 PM

## 2016-08-31 NOTE — Progress Notes (Signed)
CSW following to assist  with patient discharge to inpatient psychiatric placement.-Patient is voluntary. Patient clinical information sent to:  [facility]  Will Inform medical staff when bed is available.   Vivi Barrack, Theresia Majors, MSW Clinical Social Worker 5E and Psychiatric Service Line [phone] 08/31/2016  8:01 AM

## 2016-08-31 NOTE — Progress Notes (Addendum)
Patient ID: Adrian Gonzalez, male   DOB: 02-Nov-1965, 51 y.o.   MRN: [id number]  Pt currently presents with a flat affect and guarded, superficial behavior. Pt endorses anxiety and chronic pain in his knees and lower back. Pt states "I have pins in my knees and back." Pt reports poor sleep PTA, adheres to current medication regimen. Contraband (chewing tobacco) found in a baggy during environmental rounds tonight. Pt first denies that he has any contraband then reports he came in with item. Pt has poor hygiene, foul body odor.  Pt provided with medications per providers orders. Pt's labs and vitals were monitored throughout the night. Pt given a 1:1 about emotional and mental status. Pt supported and encouraged to express concerns and questions. Pt educated on medications. Reoriented to rules and regulations of unit, verbalizes understanding. Smoking cessation medications offered. Offered to redress bandages and assessed abrasions, pt refuses interventions.   Pt's safety ensured with 15 minute and environmental checks. Pt currently denies SI/HI and A/V hallucinations. Pt verbally agrees to seek staff if SI/HI or A/VH occurs and to consult with staff before acting on any harmful thoughts. Pt remains in his room tonight, no interaction with peers. Will continue POC.

## 2016-08-31 NOTE — Progress Notes (Addendum)
Patient has been accepted to [facility],  [address] [place] called for pick up: 12:30pm Nurse given report: [phone]   Vivi Barrack, Theresia Majors, MSW Clinical Social Worker 5E and Psychiatric Service Line [phone] 08/31/2016  12:01 PM

## 2016-08-31 NOTE — Progress Notes (Signed)
Inpatient Diabetes Program Recommendations  AACE/ADA: New Consensus Statement on Inpatient Glycemic Control (2015)  Target Ranges:  Prepandial:   less than 140 mg/dL      Peak postprandial:   less than 180 mg/dL (1-2 hours)      Critically ill patients:  140 - 180 mg/dL   Lab Results  Component Value Date   GLUCAP 271 (H) 08/31/2016   HGBA1C 10.1 (H) 08/28/2016    Review of Glycemic Control  FBS and post-prandials > 180 mg/dL. Needs insulin adjustment.  Inpatient Diabetes Program Recommendations:    Increase Lantus to 40 units QHS Increase Novolog to 8 units tidwc for meal coverage insulin.  To be d/ced to [facility]. Will follow there.  Thank you. Ailene Ards, RD, LDN, CDE Inpatient Diabetes Coordinator [phone]

## 2016-08-31 NOTE — Discharge Summary (Signed)
Discharge Summary  Adrian Gonzalez RUE:[id number] DOB: 11-20-1965  PCP: No PCP Per Patient  Admit date: 08/28/2016 Discharge date: 08/31/2016  Time spent: 25 minutes   Recommendations for Outpatient Follow-up:  1. Patient being transferred to behavioral health facility. Currently he is being discharged on Lantus 40 units subcutaneous daily at bedtime plus NovoLog 3 times a day. Diabetes core needle continue to follow and when patient is discharged from behavioral health, his insulin needs to be changed over to insulin 70/30 40 units twice a day or higher dose if his Lantus is adjusted prior to discharge. 2. Patient will also need a referral to the [facility]  Discharge Diagnoses:  Active Hospital Problems   Diagnosis Date Noted  . DM (diabetes mellitus) type 2, uncontrolled, with ketoacidosis (HCC) 08/31/2016  . MDD (major depressive disorder), recurrent severe, without psychosis (HCC) 08/29/2016  . Dehydration 08/28/2016  . Sinus tachycardia 08/28/2016  . Hyperkalemia 08/28/2016  . Diarrhea 08/28/2016  . Precordial chest pain 08/28/2016    Resolved Hospital Problems   Diagnosis Date Noted Date Resolved  . DKA, type 2 (HCC) 08/28/2016 08/31/2016    Discharge Condition: Improved, being transferred to behavioral health facility   Diet recommendation: Carb modified   Vitals:   08/30/16 2239 08/31/16 0659  BP: (!) 151/92 (!) 152/92  Pulse: 94 84  Resp: 20 20  Temp: 97.8 F (36.6 C) 98.2 F (36.8 C)    History of present illness:  51 year old male past history of diabetes mellitus and depression and anxiety admitted on 4/16 for uncontrolled diabetes mellitus with early signs of ketoacidosis as well as severe depression. Patient had run out of his medications a week prior.   Hospital Course:  Principal Problem:   DM (diabetes mellitus) type 2, uncontrolled, with ketoacidosis (HCC): DKA itself resolved by 4/17. Diabetes quitting her weight  in and patient was changed over to Lantus plus NovoLog sliding scale plus NovoLog scheduled 3 times a day. By time of discharge, patient on Lantus 40 units daily at bedtime plus NovoLog scheduled 8 units 3 times a day with meals plus sliding scale. Since patient finances are an issue, would advise that upon discharge from behavioral health, whatever dose that he is on of Lantus once a day, he received a prescription for insulin 70/30 at the same dose but to be given twice a day. Therefore, currently at Lantus 40 units daily at bedtime, if this dose stays the same, he would be discharged on insulin 70/30 40 units 3 times a day. Active Problems:   Dehydration   Sinus tachycardia: Improving. Initially in part from dehydration from DKA and then from anxiety   Hyperkalemia: Secondary to hyperglycemia. Resolved   Diarrhea: Resolved following admission    MDD (major depressive disorder), recurrent severe, without psychosis [facility]): Patient evaluated by psychiatry who recommended inpatient treatment of his depression. Patient was accepted at behavioral health in bed was available on 4/19.   Procedures:  None   Consultations:  Psychiatry   Discharge Exam: BP (!) 152/92 (BP Location: Right Arm)   Pulse 84   Temp 98.2 F (36.8 C) (Oral)   Resp 20   Ht  (1.93 m)   Wt 128.1 kg (282 lb 6.6 oz)   SpO2 99%   BMI 34.38 kg/m   General: Alert and oriented 3, no acute distress  Cardiovascular: Regular rate and rhythm, S1-S2  Respiratory: Clear to auscultation bilaterally   Discharge Instructions You were cared  for by a hospitalist during your hospital stay. If you have any questions about your discharge medications or the care you received while you were in the hospital after you are discharged, you can call the unit and asked to speak with the hospitalist on call if the hospitalist that took care of you is not available. Once you are discharged, your primary care physician will handle any further  medical issues. Please note that NO REFILLS for any discharge medications will be authorized once you are discharged, as it is imperative that you return to your primary care physician (or establish a relationship with a primary care physician if you do not have one) for your aftercare needs so that they can reassess your need for medications and monitor your lab values.    No Known Allergies   Allergies as of 08/31/2016   No Known Allergies     Medication List    STOP taking these medications   glipiZIDE 5 MG 24 hr tablet Commonly known as:  GLUCOTROL XL   metFORMIN 500 MG 24 hr tablet Commonly known as:  GLUCOPHAGE-XR     TAKE these medications   FLUoxetine 20 MG capsule Commonly known as:  PROZAC Take 1 capsule (20 mg total) by mouth daily. Start taking on:  09/01/2016   insulin aspart 100 UNIT/ML injection Commonly known as:  novoLOG Inject 8 Units into the skin 3 (three) times daily with meals.   insulin aspart 100 UNIT/ML injection Commonly known as:  novoLOG Inject 0-9 Units into the skin at bedtime.   insulin aspart 100 UNIT/ML injection Commonly known as:  novoLOG Inject 0-20 Units into the skin 3 (three) times daily with meals.   insulin glargine 100 UNIT/ML injection Commonly known as:  LANTUS Inject 0.4 mLs (40 Units total) into the skin at bedtime.   losartan 25 MG tablet Commonly known as:  COZAAR Take 1 tablet (25 mg total) by mouth daily. Start taking on:  09/01/2016   mirtazapine 15 MG tablet Commonly known as:  REMERON Take 1 tablet (15 mg total) by mouth at bedtime.           The results of significant diagnostics from this hospitalization (including imaging, microbiology, ancillary and laboratory) are listed below for reference.    Significant Diagnostic Studies: Ct Head Wo Contrast  Result Date: 08/28/2016 CLINICAL DATA:  Status post fall, with injury at the right zygoma. Hyperglycemia. Initial encounter. EXAM: CT HEAD WITHOUT CONTRAST  TECHNIQUE: Contiguous axial images were obtained from the base of the skull through the vertex without intravenous contrast. COMPARISON:  None. FINDINGS: Brain: No evidence of acute infarction, hemorrhage, hydrocephalus, extra-axial collection or mass lesion/mass effect. Prominence of the sulci suggests mild cortical volume loss. Mild cerebellar atrophy is noted. The brainstem and fourth ventricle are within normal limits. The basal ganglia are unremarkable in appearance. The cerebral hemispheres demonstrate grossly normal gray-white differentiation. No mass effect or midline shift is seen. Vascular: No hyperdense vessel or unexpected calcification. Skull: There is no evidence of fracture; visualized osseous structures are unremarkable in appearance. Sinuses/Orbits: The orbits are within normal limits. The paranasal sinuses and mastoid air cells are well-aerated. Other: Mild soft tissue swelling is noted overlying the right zygomatic arch. IMPRESSION: 1. No evidence of traumatic intracranial injury or fracture. 2. Mild soft tissue swelling overlying the right zygomatic arch. 3. Mild cortical volume loss noted. Electronically Signed   By: Roanna Raider M.D.   On: 08/28/2016 23:59    Microbiology: Recent Results (  from the past 240 hour(s))  Culture, Urine     Status: Abnormal   Collection Time: 08/28/16  1:10 PM  Result Value Ref Range Status   Specimen Description URINE, RANDOM  Final   Special Requests NONE  Final   Culture >=100,000 COLONIES/mL STAPHYLOCOCCUS AUREUS (A)  Final   Report Status 08/31/2016 FINAL  Final   Organism ID, Bacteria STAPHYLOCOCCUS AUREUS (A)  Final      Susceptibility   Staphylococcus aureus - MIC*    CIPROFLOXACIN <=0.5 SENSITIVE Sensitive     GENTAMICIN <=0.5 SENSITIVE Sensitive     NITROFURANTOIN <=16 SENSITIVE Sensitive     OXACILLIN <=0.25 SENSITIVE Sensitive     TETRACYCLINE >=16 RESISTANT Resistant     VANCOMYCIN <=0.5 SENSITIVE Sensitive     TRIMETH/SULFA <=10  SENSITIVE Sensitive     CLINDAMYCIN <=0.25 SENSITIVE Sensitive     RIFAMPIN <=0.5 SENSITIVE Sensitive     Inducible Clindamycin NEGATIVE Sensitive     * >=100,000 COLONIES/mL STAPHYLOCOCCUS AUREUS  MRSA PCR Screening     Status: Abnormal   Collection Time: 08/28/16 10:25 PM  Result Value Ref Range Status   MRSA by PCR POSITIVE (A) NEGATIVE Final    Comment:        The GeneXpert MRSA Assay (FDA approved for NASAL specimens only), is one component of a comprehensive MRSA colonization surveillance program. It is not intended to diagnose MRSA infection nor to guide or monitor treatment for MRSA infections. RESULT CALLED TO, READ BACK BY AND VERIFIED WITHSalena Saner [facility] RN [id number] 08/29/16 A NAVARRO      Labs: Basic Metabolic Panel:  Recent Labs Lab 08/28/16 1213 08/28/16 1411 08/28/16 1548 08/28/16 2205 08/29/16 1503 08/30/16 0324  NA 133* 136 135 135 135 138  K 5.2* 5.4* 4.0 3.6 3.9 3.4*  CL 103 103 106 104 104 109  CO2 18*  --  21* GLUCOSE 338* 382* 326* 375* 315* 154*  BUN CREATININE 0.90 0.80 0.99 0.91 0.84 0.64  CALCIUM 9.2  --  8.9 9.1 8.9 8.8*  MG  --   --   --   --   --  1.8   Liver Function Tests:  Recent Labs Lab 08/28/16 1213  AST 28  ALT 21  ALKPHOS 82  BILITOT 2.1*  PROT 7.9  ALBUMIN 4.7   No results for input(s): LIPASE, AMYLASE in the last 168 hours. No results for input(s): AMMONIA in the last 168 hours. CBC:  Recent Labs Lab 08/28/16 1213 08/28/16 1411 08/29/16 1503  WBC 10.0  --  6.2  HGB 16.8 15.3 14.5  HCT 46.6 45.0 41.7  MCV 84.0  --  86.0  PLT 228  --  187   Cardiac Enzymes:  Recent Labs Lab 08/28/16 2205 08/29/16 1503  TROPONINI <0.03 <0.03   BNP: BNP (last 3 results) No results for input(s): BNP in the last 8760 hours.  ProBNP (last 3 results) No results for input(s): PROBNP in the last 8760 hours.  CBG:  Recent Labs Lab 08/30/16 1310 08/30/16 1718 08/30/16 2237 08/31/16 0807  08/31/16 1244  GLUCAP 244* 240* 313* 271* 215*       Signed:  Hollice Espy, MD [facility] 08/31/2016, 1:10 PM

## 2016-09-01 DIAGNOSIS — Z813 Family history of other psychoactive substance abuse and dependence: Secondary | ICD-10-CM

## 2016-09-01 DIAGNOSIS — Z818 Family history of other mental and behavioral disorders: Secondary | ICD-10-CM

## 2016-09-01 DIAGNOSIS — R072 Precordial pain: Secondary | ICD-10-CM

## 2016-09-01 DIAGNOSIS — F332 Major depressive disorder, recurrent severe without psychotic features: Principal | ICD-10-CM

## 2016-09-01 DIAGNOSIS — Z811 Family history of alcohol abuse and dependence: Secondary | ICD-10-CM

## 2016-09-01 DIAGNOSIS — R000 Tachycardia, unspecified: Secondary | ICD-10-CM

## 2016-09-01 DIAGNOSIS — E875 Hyperkalemia: Secondary | ICD-10-CM

## 2016-09-01 DIAGNOSIS — R197 Diarrhea, unspecified: Secondary | ICD-10-CM

## 2016-09-01 DIAGNOSIS — E860 Dehydration: Secondary | ICD-10-CM

## 2016-09-01 DIAGNOSIS — E1110 Type 2 diabetes mellitus with ketoacidosis without coma: Secondary | ICD-10-CM

## 2016-09-01 LAB — GLUCOSE, CAPILLARY
GLUCOSE-CAPILLARY: 271 mg/dL — AB (ref 65–99)
GLUCOSE-CAPILLARY: 308 mg/dL — AB (ref 65–99)
Glucose-Capillary: 474 mg/dL — ABNORMAL HIGH (ref 65–99)
Glucose-Capillary: 355 mg/dL — ABNORMAL HIGH (ref 65–99)
Glucose-Capillary: 300 mg/dL — ABNORMAL HIGH (ref 65–99)

## 2016-09-01 MED ORDER — LIDOCAINE 5 % EX PTCH
1.0000 | MEDICATED_PATCH | Freq: Every day | CUTANEOUS | Status: DC
  Administered 2016-09-01 – 2016-09-03 (×3): 1 via TRANSDERMAL
  Filled 2016-09-01 (×8): qty 1

## 2016-09-01 MED ORDER — GABAPENTIN 100 MG PO CAPS
100.0000 mg | ORAL_CAPSULE | Freq: Three times a day (TID) | ORAL | Status: DC
  Administered 2016-09-01 – 2016-09-03 (×6): 100 mg via ORAL
  Filled 2016-09-01 (×9): qty 1

## 2016-09-01 MED ORDER — DULOXETINE HCL 30 MG PO CPEP
30.0000 mg | ORAL_CAPSULE | Freq: Every day | ORAL | Status: DC
  Administered 2016-09-02 – 2016-09-04 (×3): 30 mg via ORAL
  Filled 2016-09-01 (×5): qty 1

## 2016-09-01 MED ORDER — MIRTAZAPINE 7.5 MG PO TABS
7.5000 mg | ORAL_TABLET | Freq: Every day | ORAL | Status: DC
  Administered 2016-09-01 – 2016-09-05 (×5): 7.5 mg via ORAL
  Filled 2016-09-01 (×3): qty 1
  Filled 2016-09-01: qty 7
  Filled 2016-09-01 (×4): qty 1

## 2016-09-01 NOTE — Progress Notes (Signed)
Nursing Note 09/01/2016 [id number]  Data Reports sleeping well- did not complete self inventory.  Patient affect downcast and depressed.  Reports a lot of anxiety today, back pain and knee pain. Denies HI, SI, AVH.  CBG's poorly controlled- CBG before supper was 471- NP aware.   Action Spoke with patient 1:1, nurse offered support to patient throughout shift.  MD ordered scheduled meds for pain- given tylenol PRN.  Received order for diabetic consult- instructed to give sliding scale/meal coverage and recheck 1-2 hours post meal. Oncoming RN notified.  Continues to be monitored on 15 minute checks for safety.  Response Pain decreased in afternoon- effective.  Remains safe on unit.

## 2016-09-01 NOTE — Plan of Care (Signed)
Problem: Education: Goal: Knowledge of [facility] Education information/materials will improve Outcome: Progressing Pt re-educated on rules and regulations of unit, verbal understanding expressed

## 2016-09-01 NOTE — Progress Notes (Signed)
Pt attended the evening AA speaker meeting. Pt was engaged and appropriate. Ian Castagna C, NT 09/01/16 8:42 PM 

## 2016-09-01 NOTE — Progress Notes (Signed)
Inpatient Diabetes Program Recommendations  AACE/ADA: New Consensus Statement on Inpatient Glycemic Control (2015)  Target Ranges:  Prepandial:   less than 140 mg/dL      Peak postprandial:   less than 180 mg/dL (1-2 hours)      Critically ill patients:  140 - 180 mg/dL   Lab Results  Component Value Date   GLUCAP 474 (H) 09/01/2016   HGBA1C 10.1 (H) 08/28/2016    Review of Glycemic Control  Diabetes Consult - see Progress Notes dated 4/18 and 08/31/2016 by Diabetes Coordinator.  Inpatient Diabetes Program Recommendations:    Increase Lantus to 40 units QHS Increase Novolog to 8 units tidwc for meal coverage  Add HS correction.  Will continue to follow.  Thank you. Ailene Ards, RD, LDN, CDE Inpatient Diabetes Coordinator [phone]

## 2016-09-01 NOTE — [facility] Suicide Risk Assessment (Signed)
[facility] INPATIENT:  Family/Significant Other Suicide Prevention Education  Suicide Prevention Education:  Patient Refusal for Family/Significant Other Suicide Prevention Education: The patient Adrian Gonzalez has refused to provide written consent for family/significant other to be provided Family/Significant Other Suicide Prevention Education during admission and/or prior to discharge.  Physician notified.  SPE completed with pt, as pt refused to consent to family contact. SPI pamphlet provided to pt and pt was encouraged to share information with support network, ask questions, and talk about any concerns relating to SPE. Pt denies access to guns/firearms and verbalized understanding of information provided. Mobile Crisis information also provided to pt.    Ledell Peoples Smart 09/01/2016, 3:56 PM

## 2016-09-01 NOTE — Progress Notes (Signed)
Recreation Therapy Notes  Date: 09/01/16 Time: 0930 Location: 400 [address] Dayroom  Group Topic: Stress Management  Goal Area(s) Addresses:  Patient will verbalize importance of using healthy stress management.  Patient will identify positive emotions associated with healthy stress management.   Intervention: Stress Management  Activity :  [facility].  LRT introduced the stress management technique of guided imagery.  LRT read a script to allow patients to engage in the technique.  Patients were to follow along as LRT read the script to participate in the stress management technique.  Education:  Stress Management, Discharge Planning.   Education Outcome: Acknowledges edcuation/In group clarification offered/Needs additional education  Clinical Observations/Feedback: Pt did not attend group.   Caroll Rancher, LRT/CTRS         Caroll Rancher A 09/01/2016 12:25 PM

## 2016-09-01 NOTE — [facility] Suicide Risk Assessment (Addendum)
[facility] Admission Suicide Risk Assessment   Nursing information obtained from:  Patient Demographic factors:  Male Current Mental Status:  NA Loss Factors:  Loss of significant relationship Historical Factors:  Family history of suicide Risk Reduction Factors:  Living with another person, especially a relative  Total Time spent with patient: 45 minutes Principal Problem:  MDD  Diagnosis:   Patient Active Problem List   Diagnosis Date Noted  . DM (diabetes mellitus) type 2, uncontrolled, with ketoacidosis (HCC) [E11.10] 08/31/2016  . MDD (major depressive disorder), recurrent severe, without psychosis (HCC) [F33.2] 08/29/2016  . Dehydration [E86.0] 08/28/2016  . Sinus tachycardia [R00.0] 08/28/2016  . Hyperkalemia [E87.5] 08/28/2016  . Diarrhea [R19.7] 08/28/2016  . Precordial chest pain [R07.2] 08/28/2016    Continued Clinical Symptoms:  Alcohol Use Disorder Identification Test Final Score (AUDIT): 0 The "Alcohol Use Disorders Identification Test", Guidelines for Use in Primary Care, Second Edition.  World Science writer [facility]). Score between 0-7:  no or low risk or alcohol related problems. Score between 8-15:  moderate risk of alcohol related problems. Score between 16-19:  high risk of alcohol related problems. Score 20 or above:  warrants further diagnostic evaluation for alcohol dependence and treatment.   CLINICAL FACTORS:  Patient reports worsening depression over recent weeks after losing custody of his children. States he has been suffering from depression x 3 years, after his wife passed away. Reports significant chronic pain, affecting mostly back and knees . Denies alcohol or drug abuse    Psychiatric Specialty Exam: Physical Exam  ROS  Blood pressure (!) 128/97, pulse (!) 125, temperature 98.2 F (36.8 C), resp. rate 18, height  (1.93 m), weight 117.9 kg (260 lb), SpO2 100 %.Body mass index is 31.65 kg/m.   see admit note MSE     COGNITIVE FEATURES  THAT CONTRIBUTE TO RISK:  No gross cognitive deficits noted upon discharge. Is alert , attentive, and oriented x 3   SUICIDE RISK:   Moderate  PLAN OF CARE: Patient will be admitted to inpatient psychiatric unit for stabilization and safety. Will provide and encourage milieu participation. Provide medication management and maked adjustments as needed.  Will follow daily.    I certify that inpatient services furnished can reasonably be expected to improve the patient's condition.   Craige Cotta, MD 09/01/2016, 10:48 AM

## 2016-09-01 NOTE — H&P (Addendum)
Psychiatric Admission Assessment Adult  Patient Identification: Adrian Gonzalez MRN:  [id number] Date of Evaluation:  09/01/2016 Chief Complaint:  " More depressed " Principal Diagnosis:  MDD , No Psychotic Features  Diagnosis:   Patient Active Problem List   Diagnosis Date Noted  . DM (diabetes mellitus) type 2, uncontrolled, with ketoacidosis (HCC) [E11.10] 08/31/2016  . MDD (major depressive disorder), recurrent severe, without psychosis (HCC) [F33.2] 08/29/2016  . Dehydration [E86.0] 08/28/2016  . Sinus tachycardia [R00.0] 08/28/2016  . Hyperkalemia [E87.5] 08/28/2016  . Diarrhea [R19.7] 08/28/2016  . Precordial chest pain [R07.2] 08/28/2016   History of Present Illness: 51 year old man, reports chronic depression , starting three years after his wife passed away. States that about a month ago he unfairly lost custody of his children , due to " someone making false statements about me abusing my kid". States " that was the straw that broke the camel's back, and I have been feeling even  more depressed since then". Presented to the ED due to worsening depression, with thoughts of " hitting my head against a wall, or cutting myself , but not thoughts of killing myself".  Endorses neuro-vegetative symptoms of depression and increased anxiety over recent days to weeks. Denies psychotic symptoms. Denies drug or alcohol abuse, and admission UDS negative, BAL < 5 .  Associated Signs/Symptoms: Depression Symptoms:  depressed mood, anhedonia, insomnia, recurrent thoughts of death, loss of energy/fatigue, decreased appetite, has lost about 20 lbs over a period of several weeks (Hypo) Manic Symptoms:   No  Anxiety Symptoms:  Reports worrying excessively, reports some panic attacks  Psychotic Symptoms:  Denies  PTSD Symptoms: Denies  Total Time spent with patient: 45 minutes  Past Psychiatric History: no prior psychiatric admissions, states he has never attempted suicide and denies  history of self injurious behaviors, denies history of mania/hypomania, denies history of psychosis, denies history of violence . Denies history of panic, no agoraphobia.  Is the patient at risk to self? Yes.    Has the patient been a risk to self in the past 6 months? No.  Has the patient been a risk to self within the distant past? No.  Is the patient a risk to others? No.  Has the patient been a risk to others in the past 6 months? No.  Has the patient been a risk to others within the distant past? No.   Prior Inpatient Therapy:  denies  Prior Outpatient Therapy:  not at this time  Alcohol Screening: Patient refused Alcohol Screening Tool: Yes 1. How often do you have a drink containing alcohol?: Never 9. Have you or someone else been injured as a result of your drinking?: No 10. Has a relative or friend or a doctor or another health worker been concerned about your drinking or suggested you cut down?: No Alcohol Use Disorder Identification Test Final Score (AUDIT): 0 Brief Intervention: Patient declined brief intervention Substance Abuse History in the last 12 months:  Patient reports history of heavy , daily drinking for a period of about a year after his wife passed away three years ago, but states he stopped drinking 2 years ago, denies drug abuse  Consequences of Substance Abuse: Denies  Previous Psychotropic Medications: remembers being prescribed Cymbalta in the past, took it briefly, but does feel it helped, and does not remember having had side effects. Recently started on Prozac , but unsure if helping.  Psychological Evaluations: No  Past Medical History: DM,   history of degenerative  arthritis, back pain, and bilateral chronic knee pain  Past Medical History:  Diagnosis Date  . Diabetes mellitus without complication (HCC)   . Hypertension     Past Surgical History:  Procedure Laterality Date  . BACK SURGERY    . KNEE SURGERY     Family History: father died 73 (  committed suicide), mother alive, has one brother  Family Psychiatric  History:father had history of depression, alcohol dependence, and committed suicide in 70. Brother has history of Cocaine Abuse  Tobacco Screening: Have you used any form of tobacco in the last 30 days? (Cigarettes, Smokeless Tobacco, Cigars, and/or Pipes): Yes Tobacco use, Select all that apply: smokeless tobacco use daily Are you interested in Tobacco Cessation Medications?: Yes, will notify MD for an order Counseled patient on smoking cessation including recognizing danger situations, developing coping skills and basic information about quitting provided: Refused/Declined practical counseling Social History: widowed three years ago, has 51 year old twins, who are currently with his sister in law, lives with mother, currently not employed . History  Alcohol Use No     History  Drug use: Unknown    Additional Social History:  Allergies:  No Known Allergies Lab Results:  Results for orders placed or performed during the hospital encounter of 08/31/16 (from the past 48 hour(s))  Glucose, capillary     Status: Abnormal   Collection Time: 08/31/16  4:55 PM  Result Value Ref Range   Glucose-Capillary 287 (H) 65 - 99 mg/dL  Glucose, capillary     Status: Abnormal   Collection Time: 08/31/16  9:38 PM  Result Value Ref Range   Glucose-Capillary 319 (H) 65 - 99 mg/dL   Comment 1 Notify RN   Glucose, capillary     Status: Abnormal   Collection Time: 09/01/16  6:21 AM  Result Value Ref Range   Glucose-Capillary 271 (H) 65 - 99 mg/dL    Blood Alcohol level:  Lab Results  Component Value Date   ETH <5 08/28/2016    Metabolic Disorder Labs:  Lab Results  Component Value Date   HGBA1C 10.1 (H) 08/28/2016   MPG 243 08/28/2016   No results found for: PROLACTIN Lab Results  Component Value Date   CHOL  11/20/2006    146        ATP III CLASSIFICATION:  <200     mg/dL   Desirable  161-096  mg/dL   Borderline  High  >=045    mg/dL   High   TRIG 409 81/19/1478   HDL 27 (L) 11/20/2006   CHOLHDL 5.4 11/20/2006   VLDL 26 11/20/2006   LDLCALC  11/20/2006    93        Total Cholesterol/HDL:CHD Risk Coronary Heart Disease Risk Table                     Men   Women  1/2 Average Risk   3.4   3.3    Current Medications: Current Facility-Administered Medications  Medication Dose Route Frequency Provider Last Rate Last Dose  . acetaminophen (TYLENOL) tablet 650 mg  650 mg Oral Q6H PRN Laveda Abbe, NP   650 mg at 08/31/16 2213  . alum & mag hydroxide-simeth (MAALOX/MYLANTA) 200-200-20 MG/5ML suspension 30 mL  30 mL Oral Q4H PRN Laveda Abbe, NP      . FLUoxetine (PROZAC) capsule 20 mg  20 mg Oral Daily Laveda Abbe, NP   20 mg at 09/01/16 0845  .  hydrOXYzine (ATARAX/VISTARIL) tablet 25 mg  25 mg Oral Q6H PRN Laveda Abbe, NP   25 mg at 09/01/16 0849  . insulin aspart (novoLOG) injection 0-20 Units  0-20 Units Subcutaneous TID WC Laveda Abbe, NP   11 Units at 09/01/16 [phone]  . insulin aspart (novoLOG) injection 5 Units  5 Units Subcutaneous TID WC Laveda Abbe, NP   5 Units at 09/01/16 [phone]  . insulin glargine (LANTUS) injection 35 Units  35 Units Subcutaneous QHS Laveda Abbe, NP   35 Units at 08/31/16 2209  . losartan (COZAAR) tablet 25 mg  25 mg Oral Daily Laveda Abbe, NP   25 mg at 09/01/16 0845  . magnesium hydroxide (MILK OF MAGNESIA) suspension 30 mL  30 mL Oral Daily PRN Laveda Abbe, NP      . mirtazapine (REMERON) tablet 15 mg  15 mg Oral QHS Laveda Abbe, NP   15 mg at 08/31/16 2210  . multivitamin with minerals tablet 1 tablet  1 tablet Oral Daily Laveda Abbe, NP   1 tablet at 09/01/16 0845  . nicotine polacrilex (NICORETTE) gum 2 mg  2 mg Oral PRN Jackelyn Poling, NP   2 mg at 09/01/16 0846   PTA Medications: Prescriptions Prior to Admission  Medication Sig Dispense Refill Last Dose  . FLUoxetine  (PROZAC) 20 MG capsule Take 1 capsule (20 mg total) by mouth daily. 30 capsule 3   . insulin aspart (NOVOLOG) 100 UNIT/ML injection Inject 8 Units into the skin 3 (three) times daily with meals. 10 mL 11   . insulin aspart (NOVOLOG) 100 UNIT/ML injection Inject 0-9 Units into the skin at bedtime. 10 mL 11   . insulin aspart (NOVOLOG) 100 UNIT/ML injection Inject 0-20 Units into the skin 3 (three) times daily with meals. 10 mL 11   . insulin glargine (LANTUS) 100 UNIT/ML injection Inject 0.4 mLs (40 Units total) into the skin at bedtime. 10 mL 11   . losartan (COZAAR) 25 MG tablet Take 1 tablet (25 mg total) by mouth daily. 30 tablet 0   . mirtazapine (REMERON) 15 MG tablet Take 1 tablet (15 mg total) by mouth at bedtime. 30 tablet 0     Musculoskeletal: Strength & Muscle Tone: within normal limits Gait & Station: walks slowly due to back and knee pain Patient leans: N/A  Psychiatric Specialty Exam: Physical Exam  Review of Systems  Constitutional: Positive for weight loss.  HENT: Negative.   Eyes: Negative.   Respiratory: Negative.   Cardiovascular: Negative.   Gastrointestinal: Positive for nausea.  Musculoskeletal: Positive for back pain.       Bilateral knee pain   Skin: Negative.   Neurological: Negative for seizures.  Endo/Heme/Allergies: Negative.   Psychiatric/Behavioral: Positive for depression.  All other systems reviewed and are negative.   Blood pressure (!) 128/97, pulse (!) 125, temperature 98.2 F (36.8 C), resp. rate 18, height  (1.93 m), weight 117.9 kg (260 lb), SpO2 100 %.Body mass index is 31.65 kg/m.  General Appearance: Fairly Groomed  Eye Contact:  Good  Speech:  Normal Rate  Volume:  Normal  Mood:  depressed, anxious   Affect:  constricted, anxious, but reactive   Thought Process:  Linear and Descriptions of Associations: Intact  Orientation:  Full (Time, Place, and Person)  Thought Content:  no hallucinations, no delusions, not internally  preoccupied   Suicidal Thoughts:  No denies suicidal plan or intention, and contracts for safety at  this time  Homicidal Thoughts:  No denies any homicidal ideations  Memory:  recent and remote grossly intact   Judgement:  Fair  Insight:  Fair  Psychomotor Activity:  Normal  Concentration:  Concentration: Good and Attention Span: Good  Recall:  Good  Fund of Knowledge:  Good  Language:  Good  Akathisia:  Negative  Handed:  Right  AIMS (if indicated):     Assets:  Communication Skills Desire for Improvement Resilience  ADL's:  Intact  Cognition:  WNL  Sleep:  Number of Hours: 3.5    Treatment Plan Summary: Daily contact with patient to assess and evaluate symptoms and progress in treatment, Medication management, Plan inpatient admission and medications as below  Observation Level/Precautions:  15 minute checks  Laboratory:  as needed   Psychotherapy:  Milieu, group therapy   Medications:   We discussed options . As above, patient reports history of good response to Cymbalta in the past, and this medication may also help address chronic pain issues. D/C Prozac  Start Cymbalta 30 mgrs QDAY- side effects reviewed . Decrease Remeron to 7.5 mgrs QHS Start Neurontin 100 mgrs TID for pain, anxiety Agrees to Lidocaine Patch to affected area of back to help alleviate pain    Consultations:  As needed   Discharge Concerns:  -   Estimated LOS: 5 days   Other:     Physician Treatment Plan for Primary Diagnosis:  MDD  Long Term Goal(s): Improvement in symptoms so as ready for discharge  Short Term Goals: Ability to verbalize feelings will improve, Ability to disclose and discuss suicidal ideas, Ability to demonstrate self-control will improve, Ability to identify and develop effective coping behaviors will improve and Ability to maintain clinical measurements within normal limits will improve  Physician Treatment Plan for Secondary Diagnosis: Active Problems:   MDD (major depressive  disorder), recurrent severe, without psychosis (HCC)  Long Term Goal(s): Improvement in symptoms so as ready for discharge  Short Term Goals: Ability to verbalize feelings will improve, Ability to disclose and discuss suicidal ideas, Ability to demonstrate self-control will improve, Ability to identify and develop effective coping behaviors will improve and Ability to maintain clinical measurements within normal limits will improve  I certify that inpatient services furnished can reasonably be expected to improve the patient's condition.    Craige Cotta, MD 4/20/20189:20 AM

## 2016-09-01 NOTE — BHH Counselor (Signed)
Adult Comprehensive Assessment  Patient ID: Adrian Gonzalez, male   DOB: [birth year], 51 y.o.   MRN: [id number]  Information Source: Information source: Patient  Current Stressors:  Educational / Learning stressors: 12th grade  Employment / Job issues: unemployed x1 year Family Relationships: lives with mother; wife died 3 years ago; 41yr old twins recently taken out of his custody and in another family member's care. father deceased Surveyor, quantity / Lack of resources (include bankruptcy): none-lives with mother; no insurance currently Housing / Lack of housing: lives with his mother Physical health (include injuries & life threatening diseases): diabetes--pt reports he manages this with medication and diet Social relationships: poor-few friends except "in [place]" Substance abuse: no drug use reported. pt reports that he drank alcohol "2 days ago after 2 1/2 years of sobriety."  Bereavement / Loss: lost wife 3 years ago; mother diagnosed with cancer recently. father deceased; recently lost custody of 51yo twins one month ago.   Living/Environment/Situation:  Living Arrangements: Parent Living conditions (as described by patient or guardian): lives with mother x 3 months How long has patient lived in current situation?: 3 months  What is atmosphere in current home: Comfortable, Paramedic, Supportive  Family History:  Marital status: Widowed Widowed, when?: 3 years ago Are you sexually active?: Yes What is your sexual orientation?: heterosexual  Has your sexual activity been affected by drugs, alcohol, medication, or emotional stress?: no  Does patient have children?: Yes How many children?: 2 How is patient's relationship with their children?: twin 59 yo girls-"I lost custody last month and still get to see them." PT reports depression increased significantly after this event.   Childhood History:  By whom was/is the patient raised?: Both parents Additional childhood history  information: father was alcoholic and died when pt was 78--"GN commit suicide." pt's mother remarried and widowed again. depression runs in his mother's side Description of patient's relationship with caregiver when they were a child: close to both parents Patient's description of current relationship with people who raised him/her: close to mother; father deceased/commit suicide when pt was 28.  How were you disciplined when you got in trouble as a child/adolescent?: n/a  Does patient have siblings?: Yes Number of Siblings: 1 Description of patient's current relationship with siblings: younger brother- "He is a drug addict."  Did patient suffer any verbal/emotional/physical/sexual abuse as a child?: No Did patient suffer from severe childhood neglect?: No Has patient ever been sexually abused/assaulted/raped as an adolescent or adult?: No Was the patient ever a victim of a crime or a disaster?: No Witnessed domestic violence?: No Has patient been effected by domestic violence as an adult?: No  Education:  Highest grade of school patient has completed: 12th grade Currently a student?: No Learning disability?: No  Employment/Work Situation:   Employment situation: Unemployed (x 1 year) Patient's job has been impacted by current illness: No What is the longest time patient has a held a job?: 30 years  Where 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  If attempted suicide, did drugs/alcohol play a role in this?: No Alcohol/Substance Abuse Treatment Hx: Denies past history If yes, describe treatment: n/a Has alcohol/substance abuse ever caused legal problems?: No  Social Support System:   Forensic psychologist System: Poor Describe Community Support System: not many friends " except in the [place]."  Type of faith/religion: n/a  How does patient's faith help to cope with current illness?: n/a   Leisure/Recreation:   Leisure and Hobbies: "I love to scuba dive"   Strengths/Needs:   What things does the patient do well?: motivated to get psychiatrically stable and get daughters back In what areas does patient struggle / problems for patient: coping skills; insight/minimizing substance use and effects on life.   Discharge Plan:   Does patient have access to transportation?: Yes (car and license) Will patient be returning to same living situation after discharge?: Yes (home with mother) Currently receiving community mental health services: No If no, would patient like referral for services when discharged?: Yes (What county[place] for med Teacher, early years/pre; [facility]) Does patient have financial barriers related to discharge medications?: No  Summary/Recommendations:   Summary and Recommendations (to be completed by the evaluator): Patient is 51yo male living in [place], [place] with his mother. He presents to the hospital seeking treatment for increased depression, SI thoughts, and recent alcohol relapse. Patient currently denies SI/HI/AVH. Patient reports several recent losses including loss of custody of twins last month, widowed 3 years ago, and mother having recent cancer diagnosis. Patient is unemployed currently and has no outpatient providers. Patient has a diagnosis of MDD. Recommendation for  patient include: crisis stabilization, therapeutic milieu, encourage group attendance and participation, medication managment for mood stabilization/detox if necessary, and development of comprehensive mental wellness/sobriety plan.   Ledell Peoples Smart LCSW 09/01/2016 3:53 PM

## 2016-09-01 NOTE — Progress Notes (Signed)
Patient ID: Adrian Gonzalez, male   DOB: 1966-04-05, 51 y.o.   MRN: [id number]  Pt currently presents with a masked affect and impulsive, manipulative behavior.  Pt reports to Clinical research associate that his long term goal is to "get my children back." Pt reports to Clinical research associate that he lost his lidocaine patch tonight, states "I think it fell off in the cafeteria because I cannot find it in my room." Pt hid pudding snacks underneath his seat tonight and asked another nurse for more pudding after writer reoriented him to the allotted amount of snacks per patient. Pt reports good sleep with current medication regimen.   Pt provided with medications per providers orders. Pt's labs and vitals were monitored throughout the night. Pt given a 1:1 about emotional and mental status. Pt supported and encouraged to express concerns and questions. Pt educated on medications, insulin management and carb modified diet.  Pt's safety ensured with 15 minute and environmental checks. Pt currently denies SI/HI and A/V hallucinations. Pt verbally agrees to seek staff if SI/HI or A/VH occurs and to consult with staff before acting on any harmful thoughts. Pt presents with limited judgement. Will continue POC.

## 2016-09-01 NOTE — Tx Team (Signed)
Interdisciplinary Treatment and Diagnostic Plan Update  09/01/2016 Time of Session: 0930 Adrian Gonzalez MRN: [id number]  Principal Diagnosis: MDD  Secondary Diagnoses: Active Problems:   MDD (major depressive disorder), recurrent severe, without psychosis (HCC)   Current Medications:  Current Facility-Administered Medications  Medication Dose Route Frequency Provider Last Rate Last Dose  . acetaminophen (TYLENOL) tablet 650 mg  650 mg Oral Q6H PRN Laveda Abbe, NP   650 mg at 09/01/16 1155  . alum & mag hydroxide-simeth (MAALOX/MYLANTA) 200-200-20 MG/5ML suspension 30 mL  30 mL Oral Q4H PRN Laveda Abbe, NP      . Adrian Gonzalez 09/02/2016] DULoxetine (CYMBALTA) DR capsule 30 mg  30 mg Oral Daily Adrian Situ Cobos, MD      . gabapentin (NEURONTIN) capsule 100 mg  100 mg Oral TID Craige Cotta, MD   100 mg at 09/01/16 1155  . hydrOXYzine (ATARAX/VISTARIL) tablet 25 mg  25 mg Oral Q6H PRN Laveda Abbe, NP   25 mg at 09/01/16 0849  . insulin aspart (novoLOG) injection 0-20 Units  0-20 Units Subcutaneous TID WC Laveda Abbe, NP   15 Units at 09/01/16 1220  . insulin aspart (novoLOG) injection 5 Units  5 Units Subcutaneous TID WC Laveda Abbe, NP   5 Units at 09/01/16 1221  . insulin glargine (LANTUS) injection 35 Units  35 Units Subcutaneous QHS Laveda Abbe, NP   35 Units at 08/31/16 2209  . lidocaine (LIDODERM) 5 % 1 patch  1 patch Transdermal Daily Craige Cotta, MD   1 patch at 09/01/16 1155  . losartan (COZAAR) tablet 25 mg  25 mg Oral Daily Laveda Abbe, NP   25 mg at 09/01/16 0845  . magnesium hydroxide (MILK OF MAGNESIA) suspension 30 mL  30 mL Oral Daily PRN Laveda Abbe, NP      . mirtazapine (REMERON) tablet 7.5 mg  7.5 mg Oral QHS Craige Cotta, MD      . multivitamin with minerals tablet 1 tablet  1 tablet Oral Daily Laveda Abbe, NP   1 tablet at 09/01/16 0845  . nicotine polacrilex (NICORETTE) gum 2 mg  2  mg Oral PRN Jackelyn Poling, NP   2 mg at 09/01/16 0846   PTA Medications: Prescriptions Prior to Admission  Medication Sig Dispense Refill Last Dose  . FLUoxetine (PROZAC) 20 MG capsule Take 1 capsule (20 mg total) by mouth daily. 30 capsule 3   . insulin aspart (NOVOLOG) 100 UNIT/ML injection Inject 8 Units into the skin 3 (three) times daily with meals. 10 mL 11   . insulin aspart (NOVOLOG) 100 UNIT/ML injection Inject 0-9 Units into the skin at bedtime. 10 mL 11   . insulin aspart (NOVOLOG) 100 UNIT/ML injection Inject 0-20 Units into the skin 3 (three) times daily with meals. 10 mL 11   . insulin glargine (LANTUS) 100 UNIT/ML injection Inject 0.4 mLs (40 Units total) into the skin at bedtime. 10 mL 11   . losartan (COZAAR) 25 MG tablet Take 1 tablet (25 mg total) by mouth daily. 30 tablet 0   . mirtazapine (REMERON) 15 MG tablet Take 1 tablet (15 mg total) by mouth at bedtime. 30 tablet 0     Patient Stressors: Financial difficulties Health problems Legal issue Loss of significant other Substance abuse  Patient Strengths: Ability for insight Average or above average intelligence Communication skills Supportive family/friends  Treatment Modalities: Medication Management, Group therapy, Case management,  1  to 1 session with clinician, Psychoeducation, Recreational therapy.   Physician Treatment Plan for Primary Diagnosis: MDD Long Term Goal(s): Improvement in symptoms so as ready for discharge Improvement in symptoms so as ready for discharge   Short Term Goals: Ability to verbalize feelings will improve Ability to disclose and discuss suicidal ideas Ability to demonstrate self-control will improve Ability to identify and develop effective coping behaviors will improve Ability to maintain clinical measurements within normal limits will improve Ability to verbalize feelings will improve Ability to disclose and discuss suicidal ideas Ability to demonstrate self-control will  improve Ability to identify and develop effective coping behaviors will improve Ability to maintain clinical measurements within normal limits will improve  Medication Management: Evaluate patient's response, side effects, and tolerance of medication regimen.  Therapeutic Interventions: 1 to 1 sessions, Unit Group sessions and Medication administration.  Evaluation of Outcomes: Progressing  Physician Treatment Plan for Secondary Diagnosis: Active Problems:   MDD (major depressive disorder), recurrent severe, without psychosis (HCC)  Long Term Goal(s): Improvement in symptoms so as ready for discharge Improvement in symptoms so as ready for discharge   Short Term Goals: Ability to verbalize feelings will improve Ability to disclose and discuss suicidal ideas Ability to demonstrate self-control will improve Ability to identify and develop effective coping behaviors will improve Ability to maintain clinical measurements within normal limits will improve Ability to verbalize feelings will improve Ability to disclose and discuss suicidal ideas Ability to demonstrate self-control will improve Ability to identify and develop effective coping behaviors will improve Ability to maintain clinical measurements within normal limits will improve     Medication Management: Evaluate patient's response, side effects, and tolerance of medication regimen.  Therapeutic Interventions: 1 to 1 sessions, Unit Group sessions and Medication administration.  Evaluation of Outcomes: Progressing   RN Treatment Plan for Primary Diagnosis: MDD Long Term Goal(s): Knowledge of disease and therapeutic regimen to maintain health will improve  Short Term Goals: Ability to remain free from injury will improve, Ability to verbalize feelings will improve and Ability to disclose and discuss suicidal ideas  Medication Management: RN will administer medications as ordered by provider, will assess and evaluate patient's  response and provide education to patient for prescribed medication. RN will report any adverse and/or side effects to prescribing provider.  Therapeutic Interventions: 1 Gonzalez 1 counseling sessions, Psychoeducation, Medication administration, Evaluate responses to treatment, Monitor vital signs and CBGs as ordered, Perform/monitor CIWA, COWS, AIMS and Fall Risk screenings as ordered, Perform wound care treatments as ordered.  Evaluation of Outcomes: Progressing   LCSW Treatment Plan for Primary Diagnosis: MDDLong Term Goal(s): Safe transition to appropriate next level of care at discharge, Engage patient in therapeutic group addressing interpersonal concerns.  Short Term Goals: Engage patient in aftercare planning with referrals and resources, Facilitate patient progression through stages of change regarding substance use diagnoses and concerns and Identify triggers associated with mental health/substance abuse issues  Therapeutic Interventions: Assess for all discharge needs, 1 to 1 time with Social worker, Explore available resources and support systems, Assess for adequacy in community support network, Educate family and significant other(s) Gonzalez suicide prevention, Complete Psychosocial Assessment, Interpersonal group therapy.  Evaluation of Outcomes: Progressing   Progress in Treatment: Attending groups: No. New to unit. Continuing to assess.  Participating in groups: No. Taking medication as prescribed: Yes. Toleration medication: Yes. Family/Significant other contact made: SPE completed with pt; pt declined to consent to family contact.  Patient understands diagnosis: Yes. Discussing patient identified problems/goals with  staff: Yes. Medical problems stabilized or resolved: Yes. Denies suicidal/homicidal ideation: Yes. Issues/concerns per patient self-inventory: No. Other: n/a   New problem(s) identified: No, Describe:  n/a  New Short Term/Long Term Goal(s): medication  stabilization; elimination of SI thoughts; development of comprehensive mental wellness/sobriety plan.   Discharge Plan or Barriers: CSW assessing. Pt plans to return home with his mother; follow-up for medication management at [facility] and therapy at [facility].   Reason for Continuation of Hospitalization: Depression Medication stabilization Suicidal ideation  Estimated Length of Stay: 3-5 days   Attendees: Patient: 09/01/2016 3:41 PM  Physician: Dr. Jama Flavors MD 09/01/2016 3:41 PM  Nursing: Danae Chen RN 09/01/2016 3:41 PM  RN Care Manager: Onnie Boer CM 09/01/2016 3:41 PM  Social Worker: Trula Slade, LCSW 09/01/2016 3:41 PM  Recreational Therapist: Juliann Pares 09/01/2016 3:41 PM  Other: Armandina Stammer NP 09/01/2016 3:41 PM  Other:  09/01/2016 3:41 PM  Other: 09/01/2016 3:41 PM    Scribe for Treatment Team: Ledell Peoples Smart, LCSW 09/01/2016 3:41 PM

## 2016-09-01 NOTE — [facility] Group Notes (Addendum)
[facility] LCSW Group Therapy 09/01/2016 1:15pm  Type of Therapy: Group Therapy- Feelings Around Relapse and Recovery  Pt did not attend, declined invitation.   Vernie Shanks, LCSW [phone] 09/01/2016 3:33 PM

## 2016-09-01 NOTE — Progress Notes (Signed)
Pt did not attend karaoke wrap up group. Caswell Corwin, NT 08/31/2016  2000

## 2016-09-01 NOTE — [facility] Group Notes (Signed)
[facility] LCSW Aftercare Discharge Planning Group Note   09/01/2016  8:45 AM  Participation Quality: Pt did not attend.  Jonathon Jordan, MSW, LCSWA 09/01/2016 9:27 AM

## 2016-09-02 LAB — GLUCOSE, CAPILLARY
GLUCOSE-CAPILLARY: 375 mg/dL — AB (ref 65–99)
Glucose-Capillary: 255 mg/dL — ABNORMAL HIGH (ref 65–99)
Glucose-Capillary: 314 mg/dL — ABNORMAL HIGH (ref 65–99)
Glucose-Capillary: 390 mg/dL — ABNORMAL HIGH (ref 65–99)

## 2016-09-02 NOTE — [facility] Counselor (Signed)
[facility] LCSW Group Therapy Note  09/02/2016  and  10:00 AM  Type of Therapy and Topic:  Group Therapy: Avoiding Self-Sabotaging and Enabling Behaviors  Participation Level: Did not attend: invited to participate yet did not despite overhead announcement and encouragement by staff   Catherine C Harrill, LCSW   

## 2016-09-02 NOTE — Progress Notes (Signed)
Writer went to pts room to check pts blood sugar. Pt was laying in bed on top of his sheets. No fitted sheet, and bed was not made. Writer asked if pt had been laying in bed all day with no sheets on his bed. Pt stated "yes, I was in too much pain to make my bed."Writer went to make up pts bed. Pt already had clean sheets in his room on his bedside table that had been there since breakfast that morning.

## 2016-09-02 NOTE — Progress Notes (Signed)
Adult Psychoeducational Group Note  Date:  09/02/2016 Time:  9:13 PM  Group Topic/Focus:  Wrap-Up Group:   The focus of this group is to help patients review their daily goal of treatment and discuss progress on daily workbooks.  Participation Level:  Did Not Attend  Additional Comments:  Pt was invited to attend, however pt remained in bed. Pt stated he was so sore he could barely move. Writer explained that laying in bed all day can make him stiff and encouraged him to try to stay out of bed and move around. Pt stated "oh, that's not it, I have these pins and screws in my back." Writer continued to encourage pt to get up and move around as laying in bed can make the pain worse.   Adrian Gonzalez 09/02/2016, 9:13 PM

## 2016-09-02 NOTE — Plan of Care (Signed)
Problem: Safety: Goal: Periods of time without injury will increase Outcome: Progressing Pt. remains a high fall risk, denies SI/HI/AVH at this time, Q 15 checks in effect.    

## 2016-09-02 NOTE — Progress Notes (Signed)
Pt dropped a can of chewing tobacco in the hallway coming back from dinner, patient was unaware that I saw him.  I confronted him and he stated that he had thrown the empty can away in the day room trash can.  After looking in the trash I did not find the tobacco.  I confronted him again and he kept saying that it was empty and he threw it away.  After patient stepped out of the room, I looked under his roommates bed and found the can of chewing tobacco.  I confronted him that I had found it under the bed and he stated that he was sorry.  The can did contain some tobacco still in it

## 2016-09-02 NOTE — Progress Notes (Signed)
Patient denies SI, HI and AVH.  Patient was found to be in possession of contraband during environmental checks.  Patient hid dip underneath of his roommates bed and denied having it.   Assess patient for safety offer  1:1 staff talks, offer medications as prescribed.  Continue to monitor as planned.

## 2016-09-02 NOTE — Progress Notes (Signed)
[facility] MD Progress Note  09/02/2016 12:39 PM Adrian Gonzalez  MRN:  [id number] Subjective:  Patient reports " I am feeling okay, just a little sore from a fall that I had prior to coming in here."  Objective: Adrian Gonzalez is awake, alert and oriented*3, seen resting in bedroom.   Denies suicidal or homicidal ideation. Denies auditory or visual hallucination and does not appear to be responding to internal stimuli. Patient reports depression and mild back discomfort from a trip and fall 3 days ago.Patient reports he is medication compliant without mediation side effects. Reports he has attended one group session on yesterday, however feels like resting today. Reports good appetite a reports he is resting well.  Support, encouragement and reassurance was provided.   Principal Problem: MDD (major depressive disorder), recurrent severe, without psychosis (HCC) Diagnosis:   Patient Active Problem List   Diagnosis Date Noted  . DM (diabetes mellitus) type 2, uncontrolled, with ketoacidosis (HCC) [E11.10] 08/31/2016  . MDD (major depressive disorder), recurrent severe, without psychosis (HCC) [F33.2] 08/29/2016  . Dehydration [E86.0] 08/28/2016  . Sinus tachycardia [R00.0] 08/28/2016  . Hyperkalemia [E87.5] 08/28/2016  . Diarrhea [R19.7] 08/28/2016  . Precordial chest pain [R07.2] 08/28/2016   Total Time spent with patient: 30 minutes  Past Psychiatric History:   Past Medical History:  Past Medical History:  Diagnosis Date  . Diabetes mellitus without complication (HCC)   . Hypertension     Past Surgical History:  Procedure Laterality Date  . BACK SURGERY    . KNEE SURGERY     Family History: History reviewed. No pertinent family history. Family Psychiatric  History:  Social History:  History  Alcohol Use No     History  Drug use: Unknown    Social History   Social History  . Marital status: Divorced    Spouse name: N/A  . Number of children: N/A  . Years of education: N/A    Social History Main Topics  . Smoking status: Never Smoker  . Smokeless tobacco: Current User    Types: Snuff  . Alcohol use No  . Drug use: Unknown  . Sexual activity: Not Asked   Other Topics Concern  . None   Social History Narrative  . None   Additional Social History:                         Sleep: Fair  Appetite:  Fair  Current Medications: Current Facility-Administered Medications  Medication Dose Route Frequency Provider Last Rate Last Dose  . acetaminophen (TYLENOL) tablet 650 mg  650 mg Oral Q6H PRN Laveda Abbe, NP   650 mg at 09/02/16 1204  . alum & mag hydroxide-simeth (MAALOX/MYLANTA) 200-200-20 MG/5ML suspension 30 mL  30 mL Oral Q4H PRN Laveda Abbe, NP      . DULoxetine (CYMBALTA) DR capsule 30 mg  30 mg Oral Daily Craige Cotta, MD   30 mg at 09/02/16 0848  . gabapentin (NEURONTIN) capsule 100 mg  100 mg Oral TID Craige Cotta, MD   100 mg at 09/02/16 1159  . hydrOXYzine (ATARAX/VISTARIL) tablet 25 mg  25 mg Oral Q6H PRN Laveda Abbe, NP   25 mg at 09/01/16 2142  . insulin aspart (novoLOG) injection 0-20 Units  0-20 Units Subcutaneous TID WC Laveda Abbe, NP   15 Units at 09/02/16 1157  . insulin aspart (novoLOG) injection 5 Units  5 Units Subcutaneous TID WC Dorise Hiss  Arville Care, NP   5 Units at 09/02/16 1158  . insulin glargine (LANTUS) injection 35 Units  35 Units Subcutaneous QHS Laveda Abbe, NP   35 Units at 09/01/16 2142  . lidocaine (LIDODERM) 5 % 1 patch  1 patch Transdermal Daily Craige Cotta, MD   1 patch at 09/02/16 0848  . losartan (COZAAR) tablet 25 mg  25 mg Oral Daily Laveda Abbe, NP   25 mg at 09/02/16 0849  . magnesium hydroxide (MILK OF MAGNESIA) suspension 30 mL  30 mL Oral Daily PRN Laveda Abbe, NP      . mirtazapine (REMERON) tablet 7.5 mg  7.5 mg Oral QHS Rockey Situ Cobos, MD   7.5 mg at 09/01/16 2142  . multivitamin with minerals tablet 1 tablet  1 tablet  Oral Daily Laveda Abbe, NP   1 tablet at 09/02/16 0848  . nicotine polacrilex (NICORETTE) gum 2 mg  2 mg Oral PRN Jackelyn Poling, NP   2 mg at 09/02/16 1127    Lab Results:  Results for orders placed or performed during the hospital encounter of 08/31/16 (from the past 48 hour(s))  Glucose, capillary     Status: Abnormal   Collection Time: 08/31/16  4:55 PM  Result Value Ref Range   Glucose-Capillary 287 (H) 65 - 99 mg/dL  Glucose, capillary     Status: Abnormal   Collection Time: 08/31/16  9:38 PM  Result Value Ref Range   Glucose-Capillary 319 (H) 65 - 99 mg/dL   Comment 1 Notify RN   Glucose, capillary     Status: Abnormal   Collection Time: 09/01/16  6:21 AM  Result Value Ref Range   Glucose-Capillary 271 (H) 65 - 99 mg/dL  Glucose, capillary     Status: Abnormal   Collection Time: 09/01/16 11:43 AM  Result Value Ref Range   Glucose-Capillary 308 (H) 65 - 99 mg/dL  Glucose, capillary     Status: Abnormal   Collection Time: 09/01/16  4:13 PM  Result Value Ref Range   Glucose-Capillary 474 (H) 65 - 99 mg/dL  Glucose, capillary     Status: Abnormal   Collection Time: 09/01/16  7:47 PM  Result Value Ref Range   Glucose-Capillary 355 (H) 65 - 99 mg/dL   Comment 1 Notify RN   Glucose, capillary     Status: Abnormal   Collection Time: 09/01/16  9:05 PM  Result Value Ref Range   Glucose-Capillary 300 (H) 65 - 99 mg/dL   Comment 1 Notify RN   Glucose, capillary     Status: Abnormal   Collection Time: 09/02/16  6:14 AM  Result Value Ref Range   Glucose-Capillary 255 (H) 65 - 99 mg/dL  Glucose, capillary     Status: Abnormal   Collection Time: 09/02/16 11:52 AM  Result Value Ref Range   Glucose-Capillary 314 (H) 65 - 99 mg/dL    Blood Alcohol level:  Lab Results  Component Value Date   ETH <5 08/28/2016    Metabolic Disorder Labs: Lab Results  Component Value Date   HGBA1C 10.1 (H) 08/28/2016   MPG 243 08/28/2016   No results found for: PROLACTIN Lab  Results  Component Value Date   CHOL  11/20/2006    146        ATP III CLASSIFICATION:  <200     mg/dL   Desirable  295-621  mg/dL   Borderline High  >=308    mg/dL   High  TRIG 128 11/20/2006   HDL 27 (L) 11/20/2006   CHOLHDL 5.4 11/20/2006   VLDL 26 11/20/2006   LDLCALC  11/20/2006    93        Total Cholesterol/HDL:CHD Risk Coronary Heart Disease Risk Table                     Men   Women  1/2 Average Risk   3.4   3.3    Physical Findings: AIMS: Facial and Oral Movements Muscles of Facial Expression: None, normal Lips and Perioral Area: None, normal Jaw: None, normal Tongue: None, normal,Extremity Movements Upper (arms, wrists, hands, fingers): None, normal Lower (legs, knees, ankles, toes): None, normal, Trunk Movements Neck, shoulders, hips: None, normal, Overall Severity Severity of abnormal movements (highest score from questions above): None, normal Incapacitation due to abnormal movements: None, normal Patient's awareness of abnormal movements (rate only patient's report): No Awareness, Dental Status Current problems with teeth and/or dentures?: No Does patient usually wear dentures?: No  CIWA:    COWS:     Musculoskeletal: Strength & Muscle Tone: within normal limits Gait & Station: normal Patient leans: N/A  Psychiatric Specialty Exam: Physical Exam  Vitals reviewed. Constitutional: He is oriented to person, place, and time.  Cardiovascular: Normal rate.   Neurological: He is alert and oriented to person, place, and time.  Psychiatric: He has a normal mood and affect. His behavior is normal.    Review of Systems  Psychiatric/Behavioral: Positive for depression.    Blood pressure 120/80, pulse (!) 123, temperature 98.2 F (36.8 C), resp. rate 18, height  (1.93 m), weight 117.9 kg (260 lb), SpO2 100 %.Body mass index is 31.65 kg/m.  General Appearance: Guarded  Eye Contact:  Fair  Speech:  Clear and Coherent  Volume:  Normal  Mood:  Anxious  and Depressed  Affect:  Depressed and Flat  Thought Process:  Coherent  Orientation:  Full (Time, Place, and Person)  Thought Content:  Hallucinations: None  Suicidal Thoughts:  No  Homicidal Thoughts:  No  Memory:  Immediate;   Fair Recent;   Fair Remote;   Fair  Judgement:  Fair  Insight:  Fair  Psychomotor Activity:  Normal  Concentration:  Concentration: Fair  Recall:  [employer] of Knowledge:  Fair  Language:  Fair  Akathisia:  No  Handed:  Right  AIMS (if indicated):     Assets:  Communication Skills Desire for Improvement Resilience Social Support  ADL's:  Intact  Cognition:  WNL  Sleep:  Number of Hours: 6.5     I agree with current treatment plan on 00/00/2018, Patient seen face-to-face for psychiatric evaluation follow-up, chart reviewed. Reviewed the information documented and agree with the treatment plan.  Treatment Plan Summary: Daily contact with patient to assess and evaluate symptoms and progress in treatment and Medication management    Continue current treatment plan on 09/02/2016 except where noted.   Will continue to monitor vitals ,medication compliance and treatment side effects while patient is here.   Per MD H&P treatment plan. As above, patient reports history of good response to Cymbalta in the past, and this medication may also help address chronic pain issues. D/C Prozac  Continue Cymbalta 30 mgrs QDAY- side effects reviewed . Continue  Remeron to 7.5 mgrs QHS Continue Neurontin 100 mgrs TID for pain, anxiety Agrees to Lidocaine Patch to affected area of back to help alleviate pain  CSW will start working on disposition.  Patient  to participate in therapeutic milieu   Oneta Rack, NP 09/02/2016, 12:39 PM

## 2016-09-02 NOTE — Progress Notes (Signed)
Writer went to pts room to ask pt to come to the dayroom to have his VS checked. Pt came to the dayroom and asked for graham crackers and peanut butter. Writer asked if pts blood sugar was low. Pt stated it was. Writer informed pt we would be leaving for breakfast in about . Pt then stated he would wait until breakfast. Writer checked pts chart for his glucose level. Pts CBG is 255. Caswell Corwin, NT 09/02/16 6:41 AM

## 2016-09-03 LAB — GLUCOSE, CAPILLARY
GLUCOSE-CAPILLARY: 283 mg/dL — AB (ref 65–99)
GLUCOSE-CAPILLARY: 264 mg/dL — AB (ref 65–99)
GLUCOSE-CAPILLARY: 326 mg/dL — AB (ref 65–99)
Glucose-Capillary: 374 mg/dL — ABNORMAL HIGH (ref 65–99)

## 2016-09-03 MED ORDER — GABAPENTIN 100 MG PO CAPS
200.0000 mg | ORAL_CAPSULE | Freq: Three times a day (TID) | ORAL | Status: DC
  Administered 2016-09-03 – 2016-09-04 (×5): 200 mg via ORAL
  Filled 2016-09-03 (×9): qty 2

## 2016-09-03 NOTE — [facility] Group Notes (Signed)
Goals Group  Date:  09/03/2016  Time:  1300    Type of Therapy:  Nurse Education :  Goals group focuses on teaching patients how to set daily goals and then how to design steps to attain these goals as they learn to be healthier.   Participation Level:  Did Not Attend  Participation Quality:  n/a   Affect:  n/a  Cognitive:  n/a  Insight: n/a   Engagement in Group: n/a  Modes of Intervention:   n/a Summary of Progress/Problems:  Adrian Gonzalez 09/03/2016, 4:12 PM

## 2016-09-03 NOTE — Progress Notes (Signed)
Patient ID: Adrian Gonzalez, male   DOB: 07-16-1965, 51 y.o.   MRN: [id number] D:Affect is flat/sad at times,mood is depressed. Pt has been isolative to room/bed and has refused participation most of the day. Requires several prompts to get up to come to window for medications and to participate minimally ,but usually goes back to his room unless its time to go to the cafeteria for meals. A:Support and encouragement offered. R:No complaints of pain or problems at this time.

## 2016-09-03 NOTE — Progress Notes (Signed)
  DATA ACTION RESPONSE  Objective- Pt. is visible in the room, seen resting in bed with eyes open. Presents with a depressed/anxious affect and mood. Pt. appears manipulative/impulsive/needywith behavior. Is isolative to room and needs encouragement from staff.   Subjective- Denies having any SI/HI/AVH at this time. Rates pain 8/10; Back. Pt. states " I think I am feeling dehydrated". Remains safe on the unit.  1:1 interaction in private to establish rapport. Encouragement, education, & support given from staff. Meds. ordered and administered. PRN Tylenol, Vistaril, and nicotine gum requested and will re-eval accordingly. BS last night was 390.   Safety maintained with Q 15 checks. Continues to follow treatment plan and will monitor closely. No additonal questions/concerns noted.

## 2016-09-03 NOTE — [facility] Group Notes (Signed)
[facility] LCSW Group Therapy  09/03/2016   10 AM  Type of Therapy:  Group Therapy ~ Supports following Discharge  Participation Level:  Did Not Attend; invited to participate yet did not despite overhead announcement and encouragement by staff  Catherine C Harrill, LCSW 

## 2016-09-03 NOTE — Progress Notes (Signed)
[facility] MD Progress Note  09/03/2016 9:12 AM Adrian Gonzalez  MRN:  [id number] Subjective:  Patient reports " feeling okay, Its just my back pain that is bothering me."  Objective:Adrian Gonzalez  Seen resting in bedroom. With concerns of lower back pain. Patient reports he hasn't asked for his pain medications this morning. Patient reports attending the AA meeting on last night. States he enjoyed the session. Patient continues to deny suicidal or homicidal ideation. Denies auditory or visual hallucination and does not appear to be responding to internal stimuli. Reports depression every time he thinks about his live and stressors. Support, encouragement and reassurance was provided.    Principal Problem: MDD (major depressive disorder), recurrent severe, without psychosis (HCC) Diagnosis:   Patient Active Problem List   Diagnosis Date Noted  . DM (diabetes mellitus) type 2, uncontrolled, with ketoacidosis (HCC) [E11.10] 08/31/2016  . MDD (major depressive disorder), recurrent severe, without psychosis (HCC) [F33.2] 08/29/2016  . Dehydration [E86.0] 08/28/2016  . Sinus tachycardia [R00.0] 08/28/2016  . Hyperkalemia [E87.5] 08/28/2016  . Diarrhea [R19.7] 08/28/2016  . Precordial chest pain [R07.2] 08/28/2016   Total Time spent with patient: 30 minutes  Past Psychiatric History:   Past Medical History:  Past Medical History:  Diagnosis Date  . Diabetes mellitus without complication (HCC)   . Hypertension     Past Surgical History:  Procedure Laterality Date  . BACK SURGERY    . KNEE SURGERY     Family History: History reviewed. No pertinent family history. Family Psychiatric  History:  Social History:  History  Alcohol Use No     History  Drug use: Unknown    Social History   Social History  . Marital status: Divorced    Spouse name: N/A  . Number of children: N/A  . Years of education: N/A   Social History Main Topics  . Smoking status: Never Smoker  . Smokeless  tobacco: Current User    Types: Snuff  . Alcohol use No  . Drug use: Unknown  . Sexual activity: Not Asked   Other Topics Concern  . None   Social History Narrative  . None   Additional Social History:                         Sleep: Fair  Appetite:  Fair  Current Medications: Current Facility-Administered Medications  Medication Dose Route Frequency Provider Last Rate Last Dose  . acetaminophen (TYLENOL) tablet 650 mg  650 mg Oral Q6H PRN Laveda Abbe, NP   650 mg at 09/02/16 2132  . alum & mag hydroxide-simeth (MAALOX/MYLANTA) 200-200-20 MG/5ML suspension 30 mL  30 mL Oral Q4H PRN Laveda Abbe, NP      . DULoxetine (CYMBALTA) DR capsule 30 mg  30 mg Oral Daily Craige Cotta, MD   30 mg at 09/03/16 0858  . gabapentin (NEURONTIN) capsule 100 mg  100 mg Oral TID Craige Cotta, MD   100 mg at 09/03/16 0858  . hydrOXYzine (ATARAX/VISTARIL) tablet 25 mg  25 mg Oral Q6H PRN Laveda Abbe, NP   25 mg at 09/02/16 2132  . insulin aspart (novoLOG) injection 0-20 Units  0-20 Units Subcutaneous TID WC Laveda Abbe, NP   11 Units at 09/03/16 [phone]  . insulin aspart (novoLOG) injection 5 Units  5 Units Subcutaneous TID WC Laveda Abbe, NP   5 Units at 09/03/16 [phone]  . insulin glargine (LANTUS) injection 35 Units  35 Units Subcutaneous QHS Laveda Abbe, NP   35 Units at 09/02/16 2132  . lidocaine (LIDODERM) 5 % 1 patch  1 patch Transdermal Daily Craige Cotta, MD   1 patch at 09/03/16 0859  . losartan (COZAAR) tablet 25 mg  25 mg Oral Daily Laveda Abbe, NP   25 mg at 09/03/16 0857  . magnesium hydroxide (MILK OF MAGNESIA) suspension 30 mL  30 mL Oral Daily PRN Laveda Abbe, NP      . mirtazapine (REMERON) tablet 7.5 mg  7.5 mg Oral QHS Rockey Situ Cobos, MD   7.5 mg at 09/02/16 2132  . multivitamin with minerals tablet 1 tablet  1 tablet Oral Daily Laveda Abbe, NP   1 tablet at 09/03/16 0857  . nicotine  polacrilex (NICORETTE) gum 2 mg  2 mg Oral PRN Jackelyn Poling, NP   2 mg at 09/02/16 2133    Lab Results:  Results for orders placed or performed during the hospital encounter of 08/31/16 (from the past 48 hour(s))  Glucose, capillary     Status: Abnormal   Collection Time: 09/01/16 11:43 AM  Result Value Ref Range   Glucose-Capillary 308 (H) 65 - 99 mg/dL  Glucose, capillary     Status: Abnormal   Collection Time: 09/01/16  4:13 PM  Result Value Ref Range   Glucose-Capillary 474 (H) 65 - 99 mg/dL  Glucose, capillary     Status: Abnormal   Collection Time: 09/01/16  7:47 PM  Result Value Ref Range   Glucose-Capillary 355 (H) 65 - 99 mg/dL   Comment 1 Notify RN   Glucose, capillary     Status: Abnormal   Collection Time: 09/01/16  9:05 PM  Result Value Ref Range   Glucose-Capillary 300 (H) 65 - 99 mg/dL   Comment 1 Notify RN   Glucose, capillary     Status: Abnormal   Collection Time: 09/02/16  6:14 AM  Result Value Ref Range   Glucose-Capillary 255 (H) 65 - 99 mg/dL  Glucose, capillary     Status: Abnormal   Collection Time: 09/02/16 11:52 AM  Result Value Ref Range   Glucose-Capillary 314 (H) 65 - 99 mg/dL  Glucose, capillary     Status: Abnormal   Collection Time: 09/02/16  5:15 PM  Result Value Ref Range   Glucose-Capillary 375 (H) 65 - 99 mg/dL  Glucose, capillary     Status: Abnormal   Collection Time: 09/02/16  8:39 PM  Result Value Ref Range   Glucose-Capillary 390 (H) 65 - 99 mg/dL   Comment 1 Notify RN   Glucose, capillary     Status: Abnormal   Collection Time: 09/03/16  6:16 AM  Result Value Ref Range   Glucose-Capillary 283 (H) 65 - 99 mg/dL   Comment 1 Notify RN     Blood Alcohol level:  Lab Results  Component Value Date   ETH <5 08/28/2016    Metabolic Disorder Labs: Lab Results  Component Value Date   HGBA1C 10.1 (H) 08/28/2016   MPG 243 08/28/2016   No results found for: PROLACTIN Lab Results  Component Value Date   CHOL  11/20/2006    146         ATP III CLASSIFICATION:  <200     mg/dL   Desirable  191-478  mg/dL   Borderline High  >=295    mg/dL   High   TRIG 621 30/86/5784   HDL 27 (L) 11/20/2006  CHOLHDL 5.4 11/20/2006   VLDL 26 11/20/2006   LDLCALC  11/20/2006    93        Total Cholesterol/HDL:CHD Risk Coronary Heart Disease Risk Table                     Men   Women  1/2 Average Risk   3.4   3.3    Physical Findings: AIMS: Facial and Oral Movements Muscles of Facial Expression: None, normal Lips and Perioral Area: None, normal Jaw: None, normal Tongue: None, normal,Extremity Movements Upper (arms, wrists, hands, fingers): None, normal Lower (legs, knees, ankles, toes): None, normal, Trunk Movements Neck, shoulders, hips: None, normal, Overall Severity Severity of abnormal movements (highest score from questions above): None, normal Incapacitation due to abnormal movements: None, normal Patient's awareness of abnormal movements (rate only patient's report): No Awareness, Dental Status Current problems with teeth and/or dentures?: No Does patient usually wear dentures?: No  CIWA:    COWS:     Musculoskeletal: Strength & Muscle Tone: within normal limits Gait & Station: normal Patient leans: N/A  Psychiatric Specialty Exam: Physical Exam  Nursing note and vitals reviewed. Constitutional: He is oriented to person, place, and time. He appears well-developed.  Cardiovascular: Normal rate.   Neurological: He is alert and oriented to person, place, and time.  Psychiatric: He has a normal mood and affect. His behavior is normal.    Review of Systems  Musculoskeletal: Positive for back pain.  Psychiatric/Behavioral: Positive for depression. The patient is nervous/anxious.     Blood pressure (!) 119/97, pulse (!) 115, temperature 97.6 F (36.4 C), temperature source Oral, resp. rate 20, height  (1.93 m), weight 117.9 kg (260 lb), SpO2 100 %.Body mass index is 31.65 kg/m.  General Appearance:  Guarded  Eye Contact:  Fair  Speech:  Clear and Coherent  Volume:  Decreased  Mood:  Depressed and Dysphoric  Affect:  Depressed and Flat  Thought Process:  Coherent  Orientation:  Full (Time, Place, and Person)  Thought Content:  Rumination  Suicidal Thoughts:  No  Homicidal Thoughts:  No  Memory:  Immediate;   Fair Recent;   Fair Remote;   Fair  Judgement:  Fair  Insight:  Fair  Psychomotor Activity:  Restlessness  Concentration:  Concentration: Fair  Recall:  [employer] of Knowledge:  Fair  Language:  Good  Akathisia:  No  Handed:  Right  AIMS (if indicated):     Assets:  Desire for Improvement Resilience Social Support  ADL's:  Intact  Cognition:  WNL  Sleep:  Number of Hours: 6.5     I agree with current treatment plan on 09/03/2016, Patient seen face-to-face for psychiatric evaluation follow-up, chart reviewed. Reviewed the information documented and agree with the treatment plan.  Treatment Plan Summary: Daily contact with patient to assess and evaluate symptoms and progress in treatment and Medication management    Continue current treatment plan on 09/03/2016 except where noted.   Will continue to monitor vitals ,medication compliance and treatment side effects while patient is here.   Per MD H&P treatment plan. As above, patient reports history of good response to Cymbalta in the past, and this medication may also help address chronic pain issues. D/C Prozac  Continue Cymbalta 30 mgrs QDAY- side effects reviewed . Continue  Remeron to 7.5 mgrs QHS Increased Neurontin 200 mgrs TID for pain, anxiety Agrees to Lidocaine Patch to affected area of back to help alleviate pain  CSW  will start working on disposition.  Patient to participate in therapeutic milieu   Oneta Rack, NP 09/03/2016, 9:12 AM

## 2016-09-04 LAB — GLUCOSE, CAPILLARY
GLUCOSE-CAPILLARY: 247 mg/dL — AB (ref 65–99)
Glucose-Capillary: 216 mg/dL — ABNORMAL HIGH (ref 65–99)
Glucose-Capillary: 356 mg/dL — ABNORMAL HIGH (ref 65–99)
Glucose-Capillary: 275 mg/dL — ABNORMAL HIGH (ref 65–99)

## 2016-09-04 MED ORDER — DULOXETINE HCL 20 MG PO CPEP
40.0000 mg | ORAL_CAPSULE | Freq: Every day | ORAL | Status: DC
  Administered 2016-09-05: 40 mg via ORAL
  Filled 2016-09-04 (×3): qty 2

## 2016-09-04 MED ORDER — GABAPENTIN 300 MG PO CAPS
300.0000 mg | ORAL_CAPSULE | Freq: Three times a day (TID) | ORAL | Status: DC
  Administered 2016-09-05 – 2016-09-06 (×4): 300 mg via ORAL
  Filled 2016-09-04 (×4): qty 1
  Filled 2016-09-04 (×2): qty 21
  Filled 2016-09-04 (×2): qty 1
  Filled 2016-09-04: qty 21
  Filled 2016-09-04: qty 1

## 2016-09-04 NOTE — Progress Notes (Signed)
D  Pt was in bed at the beginning of the shift and didn't want to get up for medications or snacks    He was observed in his room eating 3 bags of potato chips   He asks for gingerale frequently and has several full cups of ginger ale on his night stand and floor beside the bed  A    Verbal support given   Medications administered and effectiveness monitored   Encouraged healthy diabetic diet   Q 15 min checks R    Pt is safe at present

## 2016-09-04 NOTE — Progress Notes (Signed)
[facility] MD Progress Note  09/04/2016 5:27 PM Adrian Gonzalez  MRN:  [id number] Subjective:  Patient reports he is feeling better compared to admission, and at this time denies any suicidal or self injurious ideations. Continues to report pain, mostly affecting his lower back and knees .  Denies medication side effects.   Objective: I have met with patient and have discussed case with treatment team. Patient presents fairly groomed, (+) body odor, in bed. Prefers to mobilize in wheel chair due to knee pain.  Patient states lidocaine patch, Voltaren Cream not effective. Tolerating Neurontin and Cymbalta well, denies side effects at this time .  Have encouraged patient to follow diabetic diet, food options- staff reports that patient has been noted eating items such as cake in cafeteria. Explained increased risk of poorly controlled DM, diabetic complications . He expressed understanding . On unit tends to keep to self, limited participation in groups, milieu. Of note, patient states his mood is improving, and at this time states his mood is " OK". Denies any current suicidal ideations.  Principal Problem: MDD (major depressive disorder), recurrent severe, without psychosis ([place]) Diagnosis:   Patient Active Problem List   Diagnosis Date Noted  . DM (diabetes mellitus) type 2, uncontrolled, with ketoacidosis ([place]) [E11.10] 08/31/2016  . MDD (major depressive disorder), recurrent severe, without psychosis ([place]) [F33.2] 08/29/2016  . Dehydration [E86.0] 08/28/2016  . Sinus tachycardia [R00.0] 08/28/2016  . Hyperkalemia [E87.5] 08/28/2016  . Diarrhea [R19.7] 08/28/2016  . Precordial chest pain [R07.2] 08/28/2016   Total Time spent with patient: 20 minutes  Past Psychiatric History:   Past Medical History:  Past Medical History:  Diagnosis Date  . Diabetes mellitus without complication ([place])   . Hypertension     Past Surgical History:  Procedure Laterality Date  . BACK SURGERY    . KNEE  SURGERY     Family History: History reviewed. No pertinent family history. Family Psychiatric  History:  Social History:  History  Alcohol Use No     History  Drug use: Unknown    Social History   Social History  . Marital status: Divorced    Spouse name: N/A  . Number of children: N/A  . Years of education: N/A   Social History Main Topics  . Smoking status: Never Smoker  . Smokeless tobacco: Current User    Types: Snuff  . Alcohol use No  . Drug use: Unknown  . Sexual activity: Not Asked   Other Topics Concern  . None   Social History Narrative  . None   Additional Social History:   Sleep: improving   Appetite:  Good  Current Medications: Current Facility-Administered Medications  Medication Dose Route Frequency Provider Last Rate Last Dose  . acetaminophen (TYLENOL) tablet 650 mg  650 mg Oral Q6H PRN Ethelene Hal, NP   650 mg at 09/04/16 0902  . alum & mag hydroxide-simeth (MAALOX/MYLANTA) 200-200-20 MG/5ML suspension 30 mL  30 mL Oral Q4H PRN Ethelene Hal, NP      . DULoxetine (CYMBALTA) DR capsule 30 mg  30 mg Oral Daily Myer Peer Cobos, MD   30 mg at 09/04/16 0900  . gabapentin (NEURONTIN) capsule 200 mg  200 mg Oral TID Derrill Center, NP   200 mg at 09/04/16 1718  . hydrOXYzine (ATARAX/VISTARIL) tablet 25 mg  25 mg Oral Q6H PRN Ethelene Hal, NP   25 mg at 09/03/16 2056  . insulin aspart (novoLOG) injection 0-20 Units  0-20  Units Subcutaneous TID WC Ethelene Hal, NP   20 Units at 09/04/16 1718  . insulin aspart (novoLOG) injection 5 Units  5 Units Subcutaneous TID WC Ethelene Hal, NP   5 Units at 09/04/16 1719  . insulin glargine (LANTUS) injection 35 Units  35 Units Subcutaneous QHS Ethelene Hal, NP   35 Units at 09/03/16 2055  . lidocaine (LIDODERM) 5 % 1 patch  1 patch Transdermal Daily Jenne Campus, MD   1 patch at 09/03/16 0859  . losartan (COZAAR) tablet 25 mg  25 mg Oral Daily Ethelene Hal,  NP   25 mg at 09/04/16 0859  . magnesium hydroxide (MILK OF MAGNESIA) suspension 30 mL  30 mL Oral Daily PRN Ethelene Hal, NP      . mirtazapine (REMERON) tablet 7.5 mg  7.5 mg Oral QHS Myer Peer Cobos, MD   7.5 mg at 09/03/16 2056  . multivitamin with minerals tablet 1 tablet  1 tablet Oral Daily Ethelene Hal, NP   1 tablet at 09/04/16 0859  . nicotine polacrilex (NICORETTE) gum 2 mg  2 mg Oral PRN Rozetta Nunnery, NP   2 mg at 09/04/16 1539    Lab Results:  Results for orders placed or performed during the hospital encounter of 08/31/16 (from the past 48 hour(s))  Glucose, capillary     Status: Abnormal   Collection Time: 09/02/16  8:39 PM  Result Value Ref Range   Glucose-Capillary 390 (H) 65 - 99 mg/dL   Comment 1 Notify RN   Glucose, capillary     Status: Abnormal   Collection Time: 09/03/16  6:16 AM  Result Value Ref Range   Glucose-Capillary 283 (H) 65 - 99 mg/dL   Comment 1 Notify RN   Glucose, capillary     Status: Abnormal   Collection Time: 09/03/16 11:53 AM  Result Value Ref Range   Glucose-Capillary 264 (H) 65 - 99 mg/dL  Glucose, capillary     Status: Abnormal   Collection Time: 09/03/16  4:53 PM  Result Value Ref Range   Glucose-Capillary 326 (H) 65 - 99 mg/dL   Comment 1 Notify RN   Glucose, capillary     Status: Abnormal   Collection Time: 09/03/16  8:48 PM  Result Value Ref Range   Glucose-Capillary 374 (H) 65 - 99 mg/dL   Comment 1 Notify RN   Glucose, capillary     Status: Abnormal   Collection Time: 09/04/16  5:56 AM  Result Value Ref Range   Glucose-Capillary 247 (H) 65 - 99 mg/dL   Comment 1 Notify RN    Comment 2 Document in Chart   Glucose, capillary     Status: Abnormal   Collection Time: 09/04/16 12:09 PM  Result Value Ref Range   Glucose-Capillary 216 (H) 65 - 99 mg/dL  Glucose, capillary     Status: Abnormal   Collection Time: 09/04/16  5:01 PM  Result Value Ref Range   Glucose-Capillary 356 (H) 65 - 99 mg/dL    Blood  Alcohol level:  Lab Results  Component Value Date   ETH <5 52/77/8242    Metabolic Disorder Labs: Lab Results  Component Value Date   HGBA1C 10.1 (H) 08/28/2016   MPG 243 08/28/2016   No results found for: PROLACTIN Lab Results  Component Value Date   CHOL  11/20/2006    146        ATP III CLASSIFICATION:  <200     mg/dL  Desirable  200-239  mg/dL   Borderline High  >=240    mg/dL   High   TRIG 128 11/20/2006   HDL 27 (L) 11/20/2006   CHOLHDL 5.4 11/20/2006   VLDL 26 11/20/2006   LDLCALC  11/20/2006    93        Total Cholesterol/HDL:CHD Risk Coronary Heart Disease Risk Table                     Men   Women  1/2 Average Risk   3.4   3.3    Physical Findings: AIMS: Facial and Oral Movements Muscles of Facial Expression: None, normal Lips and Perioral Area: None, normal Jaw: None, normal Tongue: None, normal,Extremity Movements Upper (arms, wrists, hands, fingers): None, normal Lower (legs, knees, ankles, toes): None, normal, Trunk Movements Neck, shoulders, hips: None, normal, Overall Severity Severity of abnormal movements (highest score from questions above): None, normal Incapacitation due to abnormal movements: None, normal Patient's awareness of abnormal movements (rate only patient's report): No Awareness, Dental Status Current problems with teeth and/or dentures?: No Does patient usually wear dentures?: No  CIWA:    COWS:     Musculoskeletal: Strength & Muscle Tone: within normal limits Gait & Station: normal prefers to mobilize in wheel chair due to knee pain, states at home normally uses walker  Patient leans: N/A  Psychiatric Specialty Exam: Physical Exam  Nursing note and vitals reviewed. Constitutional: He is oriented to person, place, and time. He appears well-developed.  Cardiovascular: Normal rate.   Neurological: He is alert and oriented to person, place, and time.  Psychiatric: He has a normal mood and affect. His behavior is normal.     Review of Systems  Musculoskeletal: Positive for back pain.  Psychiatric/Behavioral: Positive for depression. The patient is nervous/anxious.   no chest pain, no shortness of breath, no vomiting  Blood pressure 114/64, pulse 75, temperature 97.9 F (36.6 C), temperature source Oral, resp. rate 16, height 6' 4"  (1.93 m), weight 117.9 kg (260 lb), SpO2 99 %.Body mass index is 31.65 kg/m.  General Appearance: Disheveled  Eye Contact:  Good  Speech:  Normal Rate  Volume:  Normal  Mood:  reports he is feeling better than on admission  Affect:  restricted   Thought Process:  Linear and Descriptions of Associations: Intact  Orientation:  Other:  fully alert and attentive   Thought Content:  no hallucinations, no delusions, not internally preoccupied   Suicidal Thoughts:  No at this time denies any suicidal or self injurious ideations, and denies any homicidal ideations  Homicidal Thoughts:  No  Memory:   Recent and remote grossly intact   Judgement:  Fair  Insight:  Fair  Psychomotor Activity:  Decreased  Concentration:  Concentration: Fair and Attention Span: Fair  Recall:  Good  Fund of Knowledge:  Good  Language:  Good  Akathisia:  Negative  Handed:  Right  AIMS (if indicated):     Assets:  Resilience  ADL's:   Fair   Cognition:  WNL  Sleep:  Number of Hours: 4.75   Assessment - patient is indicating improving mood, and today denies any suicidal ideations. His participation in groups and unit activities is limited, and as discuss with staff, does not appear invested in milieu at this time. Appetite is good, and as per staff is up for meals,  needing encouragement to make appropriate food choices and avoid foods with high sugar/carbohydrate content. Tolerating Cymbalta and Neurontin well thus  far .  Treatment Plan Summary: Daily contact with patient to assess and evaluate symptoms and progress in treatment and Medication management  Treatment plan reviewed as below today   09/04/2016  Encourage increased group and milieu participation to work on coping skills and symptom reduction  Increase  Cymbalta to 40  mgrs QDAY for depression and anxiety  Continue  Remeron  7.5 mgrs QHS for depression, insomnia  Increase  Neurontin  300 mgrs TID for pain, anxiety Continue  Lidocaine Patch to affected area of back to help alleviate pain  Treatment team working on disposition options    Jenne Campus, MD 09/04/2016, 5:27 PM   Patient ID: Adrian Gonzalez, male   DOB: Mar 26, 1966, 51 y.o.   MRN: [id number]

## 2016-09-04 NOTE — [facility] Group Notes (Signed)
[facility] LCSW Aftercare Discharge Planning Group Note   09/04/2016 9:17 AM  Participation Quality:  Pt invited. Chose to remain in bed  Brayley Mackowiak N Smart LCSW   

## 2016-09-04 NOTE — Progress Notes (Signed)
Nursing Progress Note 7p-7a  D) Patient presents depressed and anxious but is calm and cooperative with Clinical research associate. Patient reports he is "working to get my girls back; I have twin daughters who are 10". Patient denies SI/HI/AVH but complains of bilateral knee pain. Patient contracts for safety on the unit. Patient observed using wheelchair on the unit and is independent.  A) Emotional support given. 1:1 interaction and active listening provided. Patient medicated with PM orders as prescribed. Medications reviewed with patient. Patient verbalized understanding of medications without further questions.  Snacks and fluids provided. Opportunities for questions or concerns presented to patient. Patient encouraged to continue to work on treatment goals. Labs, vital signs and patient behavior monitored throughout shift. Patient safety maintained with q15 min safety checks. Fall safety precautions reviewed with patient. Patient is advised about healthy snack options for diabetes management. Patient verbalized understanding.  R) Patient receptive to interaction with nurse. Patient remains safe on the unit at this time. Patient denies any adverse medication reactions at this time. Patient is resting in bed without complaints. Will continue to monitor.

## 2016-09-04 NOTE — Progress Notes (Signed)
D: Pt presents with flat affect and depressed mood. Pt appears sad and withdrawn. Pt isolative to his room and doesn't attend scheduled groups. Pt observed to have a body odor. Pt encouraged to shower and perform ADL's, Pt agreed. Pt gait unsteady due to bilat knee pain. MD made aware. Writer provided pt with a wheelchair per Dr. Jama Flavors. Pt refused lidocaine patch this morning, stating that it's not effective. Per MHT's on the unit, pt was observed in the cafeteria eating two slices of cake along with two meal trays. Writer discussed meal options with pt. Pt not receptive. Writer will notify MD. A: Medications reviewed with pt. Medications administered as ordered per MD. Verbal support provided. Pt encouraged to attend groups. 15 minute checks performed for safety.  R: Pt non-compliant with tx.

## 2016-09-04 NOTE — [facility] Group Notes (Signed)
[facility] LCSW Group Therapy  09/04/2016 1:15pm  Type of Therapy:  Group Therapy vercoming Obstacles  Pt did not attend, declined invitation.    Bevan Disney, LCSW 09/04/2016 2:42 PM  

## 2016-09-04 NOTE — Progress Notes (Signed)
Patient did not attend group.

## 2016-09-05 LAB — GLUCOSE, CAPILLARY
GLUCOSE-CAPILLARY: 247 mg/dL — AB (ref 65–99)
Glucose-Capillary: 262 mg/dL — ABNORMAL HIGH (ref 65–99)
Glucose-Capillary: 326 mg/dL — ABNORMAL HIGH (ref 65–99)
Glucose-Capillary: 300 mg/dL — ABNORMAL HIGH (ref 65–99)

## 2016-09-05 MED ORDER — DULOXETINE HCL 60 MG PO CPEP
60.0000 mg | ORAL_CAPSULE | Freq: Every day | ORAL | Status: DC
  Administered 2016-09-06: 60 mg via ORAL
  Filled 2016-09-05 (×2): qty 1
  Filled 2016-09-05: qty 7

## 2016-09-05 NOTE — [facility] Group Notes (Signed)
[facility] LCSW Group Therapy  09/05/2016 2:49 PM  Type of Therapy:  Group Therapy  Participation Level:  Did Not Attend-pt invited. Stated that his stomach hurt and wanted to remain in bed.   Summary of Progress/Problems: [facility] Speaker came to talk about his personal journey with substance abuse and addiction. The pt processed ways by which to relate to the speaker. MHA speaker provided handouts and educational information pertaining to groups and services offered by the [facility].   Alma Muegge N Smart LCSW 09/05/2016, 2:49 PM

## 2016-09-05 NOTE — Plan of Care (Signed)
Problem: Activity: Goal: Sleeping patterns will improve Outcome: Progressing Patient reports sleeping well with current medication regimen.   

## 2016-09-05 NOTE — Progress Notes (Signed)
DAR NOTE: Patient presents with anxious affect and depressed mood. Pt has been isolating himself most of the time. Although was able to get up during lunch time, ate lunch in the day room and took a shower after that lunch. Denies auditory and visual hallucinations, complain of knees and back pain, but refused his lidocaine patch stating was not helping.  Rates depression at 10, hopelessness at 0, and anxiety at 5.  Maintained on routine safety checks.  Medications given as prescribed.  Support and encouragement offered as needed. Will continue to monitor.

## 2016-09-05 NOTE — Plan of Care (Signed)
Problem: Education: Goal: Ability to make informed decisions regarding treatment will improve Outcome: Not Progressing Pt does not comply with his diabetes diet

## 2016-09-05 NOTE — Progress Notes (Signed)
Psychoeducational Group Note  Date:  09/05/2016 Time:  2129  Group Topic/Focus:  Wrap-Up Group:   The focus of this group is to help patients review their daily goal of treatment and discuss progress on daily workbooks.  Participation Level: Did Not Attend  Participation Quality:  Not Applicable  Affect:  Not Applicable  Cognitive:  Not Applicable  Insight:  Not Applicable  Engagement in Group: Not Applicable  Additional Comments:  The patient did not attend group this evening.   Hazle Coca S 09/05/2016, 9:29 PM

## 2016-09-05 NOTE — [facility]HH Group Notes (Signed)
The focus of this group is to educate the patient on the purpose and policies of crisis stabilization and provide a format to answer questions about their admission.  The group details unit policies and expectations of patients while admitted.  Patient did not attend 0900 nurse education orientation group this morning.  Patient stayed in bed.   

## 2016-09-05 NOTE — Progress Notes (Signed)
[facility] MD Progress Note  09/05/2016 3:40 PM Adrian Gonzalez  MRN:  [id number] Subjective:   51 yo caucasian male, widower unemployed. Background history of MDD and DM. Self presented to the ER on account of worsening depression. Precipitated by recent loss of custody. Presented to seek help. No suicidal thoughts as he wants to get  His kids back.  Chart reviewed today. Nursing staff notes that he has not attended groups today. He is slightly withdrawn. He is not expressing suicidal thoughts.   Seen today. Says he has been feeling better. Not as depressed as he was when he came here. Says his goal is to get his kids back. Custody hearing is in May. Patient says he has never felt suicidal. Says he is not suicidal. He wants to be there for his kids. He has applied for disability. Says he would be staying with his mom upon discharge.  Principal Problem: MDD (major depressive disorder), recurrent severe, without psychosis (HCC) Diagnosis:   Patient Active Problem List   Diagnosis Date Noted  . DM (diabetes mellitus) type 2, uncontrolled, with ketoacidosis (HCC) [E11.10] 08/31/2016  . MDD (major depressive disorder), recurrent severe, without psychosis (HCC) [F33.2] 08/29/2016  . Dehydration [E86.0] 08/28/2016  . Sinus tachycardia [R00.0] 08/28/2016  . Hyperkalemia [E87.5] 08/28/2016  . Diarrhea [R19.7] 08/28/2016  . Precordial chest pain [R07.2] 08/28/2016   Total Time spent with patient: 20 minutes  Past Psychiatric History: As in H&P  Past Medical History:  Past Medical History:  Diagnosis Date  . Diabetes mellitus without complication (HCC)   . Hypertension     Past Surgical History:  Procedure Laterality Date  . BACK SURGERY    . KNEE SURGERY     Family History: History reviewed. No pertinent family history. Family Psychiatric  History: As in H&P Social History:  History  Alcohol Use No     History  Drug use: Unknown    Social History   Social History  . Marital status:  Divorced    Spouse name: N/A  . Number of children: N/A  . Years of education: N/A   Social History Main Topics  . Smoking status: Never Smoker  . Smokeless tobacco: Current User    Types: Snuff  . Alcohol use No  . Drug use: Unknown  . Sexual activity: Not Asked   Other Topics Concern  . None   Social History Narrative  . None   Additional Social History:        Sleep: Good  Appetite:  Good  Current Medications: Current Facility-Administered Medications  Medication Dose Route Frequency Provider Last Rate Last Dose  . acetaminophen (TYLENOL) tablet 650 mg  650 mg Oral Q6H PRN Laveda Abbe, NP   650 mg at 09/04/16 2145  . alum & mag hydroxide-simeth (MAALOX/MYLANTA) 200-200-20 MG/5ML suspension 30 mL  30 mL Oral Q4H PRN Laveda Abbe, NP      . DULoxetine (CYMBALTA) DR capsule 40 mg  40 mg Oral Daily Craige Cotta, MD   40 mg at 09/05/16 1000  . gabapentin (NEURONTIN) capsule 300 mg  300 mg Oral TID Craige Cotta, MD   300 mg at 09/05/16 1221  . hydrOXYzine (ATARAX/VISTARIL) tablet 25 mg  25 mg Oral Q6H PRN Laveda Abbe, NP   25 mg at 09/03/16 2056  . insulin aspart (novoLOG) injection 0-20 Units  0-20 Units Subcutaneous TID WC Laveda Abbe, NP   11 Units at 09/05/16 1219  . insulin aspart (  novoLOG) injection 5 Units  5 Units Subcutaneous TID WC Laveda Abbe, NP   5 Units at 09/05/16 1220  . insulin glargine (LANTUS) injection 35 Units  35 Units Subcutaneous QHS Laveda Abbe, NP   35 Units at 09/04/16 2138  . lidocaine (LIDODERM) 5 % 1 patch  1 patch Transdermal Daily Craige Cotta, MD   1 patch at 09/03/16 0859  . losartan (COZAAR) tablet 25 mg  25 mg Oral Daily Laveda Abbe, NP   25 mg at 09/05/16 1001  . magnesium hydroxide (MILK OF MAGNESIA) suspension 30 mL  30 mL Oral Daily PRN Laveda Abbe, NP      . mirtazapine (REMERON) tablet 7.5 mg  7.5 mg Oral QHS Rockey Situ Cobos, MD   7.5 mg at 09/04/16 2142   . multivitamin with minerals tablet 1 tablet  1 tablet Oral Daily Laveda Abbe, NP   1 tablet at 09/05/16 1002  . nicotine polacrilex (NICORETTE) gum 2 mg  2 mg Oral PRN Jackelyn Poling, NP   2 mg at 09/05/16 1404    Lab Results:  Results for orders placed or performed during the hospital encounter of 08/31/16 (from the past 48 hour(s))  Glucose, capillary     Status: Abnormal   Collection Time: 09/03/16  4:53 PM  Result Value Ref Range   Glucose-Capillary 326 (H) 65 - 99 mg/dL   Comment 1 Notify RN   Glucose, capillary     Status: Abnormal   Collection Time: 09/03/16  8:48 PM  Result Value Ref Range   Glucose-Capillary 374 (H) 65 - 99 mg/dL   Comment 1 Notify RN   Glucose, capillary     Status: Abnormal   Collection Time: 09/04/16  5:56 AM  Result Value Ref Range   Glucose-Capillary 247 (H) 65 - 99 mg/dL   Comment 1 Notify RN    Comment 2 Document in Chart   Glucose, capillary     Status: Abnormal   Collection Time: 09/04/16 12:09 PM  Result Value Ref Range   Glucose-Capillary 216 (H) 65 - 99 mg/dL  Glucose, capillary     Status: Abnormal   Collection Time: 09/04/16  5:01 PM  Result Value Ref Range   Glucose-Capillary 356 (H) 65 - 99 mg/dL  Glucose, capillary     Status: Abnormal   Collection Time: 09/04/16  9:28 PM  Result Value Ref Range   Glucose-Capillary 275 (H) 65 - 99 mg/dL  Glucose, capillary     Status: Abnormal   Collection Time: 09/05/16  5:56 AM  Result Value Ref Range   Glucose-Capillary 247 (H) 65 - 99 mg/dL   Comment 1 Notify RN   Glucose, capillary     Status: Abnormal   Collection Time: 09/05/16 12:07 PM  Result Value Ref Range   Glucose-Capillary 262 (H) 65 - 99 mg/dL   Comment 1 Notify RN    Comment 2 Document in Chart     Blood Alcohol level:  Lab Results  Component Value Date   ETH <5 08/28/2016    Metabolic Disorder Labs: Lab Results  Component Value Date   HGBA1C 10.1 (H) 08/28/2016   MPG 243 08/28/2016   No results found for:  PROLACTIN Lab Results  Component Value Date   CHOL  11/20/2006    146        ATP III CLASSIFICATION:  <200     mg/dL   Desirable  409-811  mg/dL   Borderline High  >=  240    mg/dL   High   TRIG 161 09/60/4540   HDL 27 (L) 11/20/2006   CHOLHDL 5.4 11/20/2006   VLDL 26 11/20/2006   LDLCALC  11/20/2006    93        Total Cholesterol/HDL:CHD Risk Coronary Heart Disease Risk Table                     Men   Women  1/2 Average Risk   3.4   3.3    Physical Findings: AIMS: Facial and Oral Movements Muscles of Facial Expression: None, normal Lips and Perioral Area: None, normal Jaw: None, normal Tongue: None, normal,Extremity Movements Upper (arms, wrists, hands, fingers): None, normal Lower (legs, knees, ankles, toes): None, normal, Trunk Movements Neck, shoulders, hips: None, normal, Overall Severity Severity of abnormal movements (highest score from questions above): None, normal Incapacitation due to abnormal movements: None, normal Patient's awareness of abnormal movements (rate only patient's report): No Awareness, Dental Status Current problems with teeth and/or dentures?: No Does patient usually wear dentures?: No  CIWA:    COWS:     Musculoskeletal: Strength & Muscle Tone: within normal limits Gait & Station: Limps a bit Patient leans: N/A  Psychiatric Specialty Exam: Physical Exam  Constitutional: He is oriented to person, place, and time. He appears well-developed and well-nourished.  HENT:  Head: Normocephalic and atraumatic.  Eyes: Conjunctivae are normal. Pupils are equal, round, and reactive to light.  Neck: Normal range of motion. Neck supple.  Cardiovascular: Normal rate and regular rhythm.   Respiratory: Effort normal and breath sounds normal.  GI: Soft. Bowel sounds are normal.  Musculoskeletal: Normal range of motion.  Neurological: He is alert and oriented to person, place, and time.  Skin: Skin is warm and dry.  Psychiatric:  As above     ROS   Blood pressure 136/83, pulse 99, temperature 97.3 F (36.3 C), temperature source Oral, resp. rate 18, height  (1.93 m), weight 117.9 kg (260 lb), SpO2 99 %.Body mass index is 31.65 kg/m.  General Appearance:  In hospital clothing. Overweight. Good rapport. Appropriate behavior. Not internally distracted.   Eye Contact:  Good  Speech:  Clear and Coherent  Volume:  Normal  Mood:  Depression has lifted  Affect:  Appropriate and Full Range  Thought Process:  Goal Directed and Linear  Orientation:  Full (Time, Place, and Person)  Thought Content:  Future oriented. No delusional theme. No preoccupation with violent thoughts. No negative ruminations. No obsession.  No hallucination in any modality.   Suicidal Thoughts:  No  Homicidal Thoughts:  No  Memory:  Immediate;   Good Recent;   Good Remote;   Good  Judgement:  Good  Insight:  Good  Psychomotor Activity:  Normal  Concentration:  Concentration: Good and Attention Span: Good  Recall:  Good  Fund of Knowledge:  Good  Language:  Good  Akathisia:  No  Handed:    AIMS (if indicated):     Assets:  Communication Skills Desire for Improvement Housing Resilience  ADL's:  Much better  Cognition:  WNL  Sleep:  Number of Hours: 6.5     Treatment Plan Summary:  Depression is responding to treatment. Patient is not a danger to himself or others. He is future oriented. We have agreed to increase his antidepressant further today. For discharge tomorrow.   Psychiatric: MDD Recurrent  Medical: DM LBP  Psychosocial:  Bereavement Loss of custody  PLAN: 1. Increase Duloxetine  to 60 mg daily 2. Encourage unit groups and activities 3. Monitor mood, behavior and interaction with peers 4. Home tomorrow.    Georgiann Cocker, MD 09/05/2016, 3:40 PM

## 2016-09-06 LAB — GLUCOSE, CAPILLARY: GLUCOSE-CAPILLARY: 278 mg/dL — AB (ref 65–99)

## 2016-09-06 MED ORDER — MIRTAZAPINE 7.5 MG PO TABS: 7.5000 mg | ORAL_TABLET | Freq: Every day | ORAL | 0 refills | Status: DC

## 2016-09-06 MED ORDER — LOSARTAN POTASSIUM 25 MG PO TABS: 25.0000 mg | ORAL_TABLET | Freq: Every day | ORAL | 0 refills | Status: DC

## 2016-09-06 MED ORDER — GABAPENTIN 300 MG PO CAPS: 300.0000 mg | ORAL_CAPSULE | Freq: Three times a day (TID) | ORAL | 0 refills | Status: DC

## 2016-09-06 MED ORDER — HYDROXYZINE HCL 25 MG PO TABS: 25.0000 mg | ORAL_TABLET | Freq: Four times a day (QID) | ORAL | 0 refills | Status: DC | PRN

## 2016-09-06 MED ORDER — DULOXETINE HCL 60 MG PO CPEP: 60.0000 mg | ORAL_CAPSULE | Freq: Every day | ORAL | 0 refills | Status: DC

## 2016-09-06 MED ORDER — NICOTINE POLACRILEX 2 MG MT GUM: 2.0000 mg | CHEWING_GUM | OROMUCOSAL | 0 refills | Status: DC | PRN

## 2016-09-06 NOTE — Progress Notes (Signed)
  [facility] Adult Case Management Discharge Plan :  Will you be returning to the same living situation after discharge:  Yes,  returning home with mother At discharge, do you have transportation home?: Yes,  uncle to pick up Do you have the ability to pay for your medications: Yes,  Pt provided with samples and prescriptions  Release of information consent forms completed and in the chart;  Patient's signature needed at discharge.  Patient to Follow up at: Follow-up Information    [facility] Follow up.   Specialty:  Behavioral Health Why:  Walk in within 7 days of hospital discharge for hospital follow-up/medication management assessment. Walk in hours: 8am-9am Monday through Friday.  [address] [place] [address] [phone]        [facility] Follow up on 09/13/2016.   Specialty:  Behavioral Health Why:  Appt for therapy on Wednesday Sep 13, 2016 at 1:30PM with Theodoro Grist. Please call within 48 hours of appt if you have to cancel or reschedule. Thank you.  Contact information: [address] [place] [address] [phone]           Next level of care provider has access to [facility] Link:no  Safety Planning and Suicide Prevention discussed: Yes,  with Pt; declines family contact  Have you used any form of tobacco in the last 30 days? (Cigarettes, Smokeless Tobacco, Cigars, and/or Pipes): Yes  Has patient been referred to the Quitline?: Patient refused referral  Patient has been referred for addiction treatment: Yes  Verdene Lennert 09/06/2016, 9:35 AM

## 2016-09-06 NOTE — Progress Notes (Signed)
D: When asked about his day pt stated, "It was a great day." Stated, "got a rocky start, but it got better". Pt informed the writer that he's scheduled to be discharged tomorrow at 11:00. States he plans to move into a "travel trailer" given to him by his uncle "rent free". States he plans to get his SS checks started again, and get his children back.  Stated he plans to let them live with his mother until he gets himself together. Pt has no other questions or concerns.    A:  Support and encouragement was offered. 15 min checks continued for safety.  R: Pt remains safe.

## 2016-09-06 NOTE — BHH Suicide Risk Assessment (Signed)
[facility] Discharge Suicide Risk Assessment   Principal Problem: MDD (major depressive disorder), recurrent severe, without psychosis (HCC) Discharge Diagnoses:  Patient Active Problem List   Diagnosis Date Noted  . DM (diabetes mellitus) type 2, uncontrolled, with ketoacidosis (HCC) [E11.10] 08/31/2016  . MDD (major depressive disorder), recurrent severe, without psychosis (HCC) [F33.2] 08/29/2016  . Dehydration [E86.0] 08/28/2016  . Sinus tachycardia [R00.0] 08/28/2016  . Hyperkalemia [E87.5] 08/28/2016  . Diarrhea [R19.7] 08/28/2016  . Precordial chest pain [R07.2] 08/28/2016    Total Time spent with patient: 30 minutes  Musculoskeletal: Strength & Muscle Tone: within normal limits Gait & Station: Limps a bit. Patient leans: N/A  Psychiatric Specialty Exam: Review of Systems  Constitutional: Negative.   HENT: Negative.   Eyes: Negative.   Respiratory: Negative.   Cardiovascular: Negative.   Gastrointestinal: Negative.   Genitourinary: Negative.   Musculoskeletal: Negative.   Skin: Negative.   Neurological: Negative.   Endo/Heme/Allergies: Negative.   Psychiatric/Behavioral: Negative for depression, hallucinations, memory loss, substance abuse and suicidal ideas. The patient is not nervous/anxious and does not have insomnia.     Blood pressure (!) 138/103, pulse (!) 112, temperature 97.7 F (36.5 C), temperature source Oral, resp. rate 18, height  (1.93 m), weight 117.9 kg (260 lb), SpO2 99 %.Body mass index is 31.65 kg/m.  General Appearance and Behavior: Casually dressed, pleasant, engaging well and cooperative. Appropriate behavior. Not in any distress. Good relatedness. Not internally stimulated  Eye Contact::  Good  Speech:  Spontaneous, normal prosody. Normal tone and rate.   Volume:  Normal  Mood:  Euthymic  Affect:  Appropriate and Full Range  Thought Process:  Goal Directed and Linear  Orientation:  Full (Time, Place, and Person)  Thought Content:  Future  oriented. No delusional theme. No preoccupation with violent thoughts. No negative ruminations. No obsession.  No hallucination in any modality.   Suicidal Thoughts:  No  Homicidal Thoughts:  No  Memory:  Immediate;   Good Recent;   Good Remote;   Good  Judgement:  Good  Insight:  Good  Psychomotor Activity:  Normal  Concentration:  Good  Recall:  Good  Fund of Knowledge:Good  Language: Good  Akathisia:  No  Handed:    AIMS (if indicated):     Assets:  Communication Skills Desire for Improvement Resilience Vocational/Educational  Sleep:  Number of Hours: 5  Cognition: WNL  ADL's:  Intact   Clinical Assessment::   51 yo caucasian male, widower unemployed. Background history of MDD and DM. Self presented to the ER on account of worsening depression. Precipitated by recent loss of custody. Presented to seek help. No suicidal thoughts as he wants to get his kids back.  Seen today. States that he is feeling better. His depression has lifted. He is tolerating his medications well. He expressed plans on how to get disability and get his kids back. Reports normal appetite and energy. Reports normal sleep at night. No suicidal thoughts. No homicidal thoughts. No thoughts of violence. No evidence of psychosis. No evidence of mania. No overwhelming anxiety.  Nursing staff reports that patient has been appropriate on the unit. Patient has been interacting well with peers. No behavioral issues. Patient has not voiced any suicidal thoughts. Patient has not been observed to be internally stimulated. Patient has been adherent with treatment recommendations. Patient has been tolerating their medication well.   Patient was discussed at team. Team members feels that patient is back to his baseline level of function. Team  agrees with plan to discharge patient today.  Demographic Factors:  Male, Caucasian and Unemployed  Loss Factors: Loss of significant relationship, Legal issues and Financial  problems/change in socioeconomic status  Historical Factors: NA  Risk Reduction Factors:   Responsible for children under 3 years of age, Sense of responsibility to family, Religious beliefs about death, Living with another person, especially a relative, Positive social support, Positive therapeutic relationship and Positive coping skills or problem solving skills  Continued Clinical Symptoms:  As above  Cognitive Features That Contribute To Risk:  None    Suicide Risk:  Minimal: No identifiable suicidal ideation.   Patient is not having any thoughts of suicide at this time. Modifiable risk factors targeted during this admission includes depression. Demographical and historical risk factors cannot be modified. Patient is now engaging well. Patient is reliable and is future oriented. We have buffered patient's support structures. At this point, patient is at low risk of suicide. Patient is aware of the effects of psychoactive substances on decision making process. Patient has been provided with emergency contacts. Patient acknowledges to use resources provided if unforseen circumstances changes their current risk stratification.   Follow-up Information    [facility] Follow up.   Specialty:  Behavioral Health Why:  Walk in within 7 days of hospital discharge for hospital follow-up/medication management assessment. Walk in hours: 8am-9am Monday through Friday.  Contact [address] [place] [address] [phone]        [facility] Follow up on 09/13/2016.   Specialty:  Behavioral Health Why:  Appt for therapy on Wednesday Sep 13, 2016 at 1:30PM with Theodoro Grist. Please call within 48 hours of appt if you have to cancel or reschedule. Thank you.  Contact information: [address] [place] [address] [phone]           Plan Of Care/Follow-up recommendations:  1. Continue current psychotropic medications 2. Mental health  follow up as arranged.     Georgiann Cocker, MD 09/06/2016, 9:27 AM

## 2016-09-06 NOTE — Progress Notes (Signed)
Recreation Therapy Notes  Date: 09/06/16 Time: 0930 Location: [address] Dayroom  Group Topic: Stress Management  Goal Area(s) Addresses:  Patient will verbalize importance of using healthy stress management.  Patient will identify positive emotions associated with healthy stress management.   Intervention: Stress Management  Activity :  Meditation.  LRT introduced the stress management technique of meditation.  LRT played a meditation focused on resilience from the Calm app to patients to fully participate in the activity.  Patients were to follow along as the meditation played to engage in the technique.  Education:  Stress Management, Discharge Planning.   Education Outcome: Acknowledges edcuation/In group clarification offered/Needs additional education  Clinical Observations/Feedback: Pt did not attend group.   Anne Sebring, LRT/CTRS         Adrian Gonzalez A 09/06/2016 11:44 AM 

## 2016-09-06 NOTE — Discharge Summary (Signed)
Physician Discharge Summary Note  Patient:  Adrian Gonzalez is an 51 y.o., male MRN:  [id number] DOB:  29-Jul-1965 Patient phone:  [phone] (home)  Patient address:   [address] [place] [address],  Total Time spent with patient: 30 minutes  Date of Admission:  08/31/2016 Date of Discharge: 09/06/2016  Reason for Admission:  Worsening depression  Principal Problem: MDD (major depressive disorder), recurrent severe, without psychosis Adrian Gonzalez -[facility]) Discharge Diagnoses: Patient Active Problem List   Diagnosis Date Noted  . DM (diabetes mellitus) type 2, uncontrolled, with ketoacidosis (HCC) [E11.10] 08/31/2016  . MDD (major depressive disorder), recurrent severe, without psychosis (HCC) [F33.2] 08/29/2016  . Dehydration [E86.0] 08/28/2016  . Sinus tachycardia [R00.0] 08/28/2016  . Hyperkalemia [E87.5] 08/28/2016  . Diarrhea [R19.7] 08/28/2016  . Precordial chest pain [R07.2] 08/28/2016    Past Psychiatric History:  See HPI  Past Medical History:  Past Medical History:  Diagnosis Date  . Diabetes mellitus without complication (HCC)   . Hypertension     Past Surgical History:  Procedure Laterality Date  . BACK SURGERY    . KNEE SURGERY     Family History: History reviewed. No pertinent family history. Family Psychiatric  History:  See HPI Social History:  History  Alcohol Use No     History  Drug use: Unknown    Social History   Social History  . Marital status: Divorced    Spouse name: N/A  . Number of children: N/A  . Years of education: N/A   Social History Main Topics  . Smoking status: Never Smoker  . Smokeless tobacco: Current User    Types: Snuff  . Alcohol use No  . Drug use: Unknown  . Sexual activity: Not Asked   Other Topics Concern  . None   Social History Narrative  . None    Hospital Course:  Adrian Gonzalez, 51 year old man, reported chronic depression that had worsened after his wife passed 3 years ago and also when recently, he lost  custody of his children.   Adrian Gonzalez was admitted for MDD (major depressive disorder), recurrent severe, without psychosis (HCC) and crisis management.  Patient was treated with medications with their indications listed below in detail under Medication List.  Medical problems were identified and treated as needed.  Home medications were restarted as appropriate.  Improvement was monitored by observation and Adrian Gonzalez daily report of symptom reduction.  Emotional and mental status was monitored by daily self inventory reports completed by Adrian Gonzalez and clinical staff.  Patient reported continued improvement, denied any new concerns.  Patient had been compliant on medications and denied side effects.  Support and encouragement was provided.         Adrian Gonzalez was evaluated by the treatment team for stability and plans for continued recovery upon discharge.  Patient was offered further treatment options upon discharge including Residential, Intensive Outpatient and Outpatient treatment. Patient will follow up with agency listed below for medication management and counseling.  Encouraged patient to maintain satisfactory support network and home environment.  Advised to adhere to medication compliance and outpatient treatment follow up.  Prescriptions provided.       Adrian Gonzalez motivation was an integral factor for scheduling further treatment.  Employment, transportation, bed availability, health status, family support, and any pending legal issues were also considered during patient's hospital stay.  Upon completion of this admission the patient was both mentally and medically stable for discharge  denying suicidal/homicidal ideation, auditory/visual/tactile hallucinations, delusional thoughts and paranoia.      Physical Findings: AIMS: Facial and Oral Movements Muscles of Facial Expression: None, normal Lips and Perioral Area: None, normal Jaw: None, normal Tongue: None,  normal,Extremity Movements Upper (arms, wrists, hands, fingers): None, normal Lower (legs, knees, ankles, toes): None, normal, Trunk Movements Neck, shoulders, hips: None, normal, Overall Severity Severity of abnormal movements (highest score from questions above): None, normal Incapacitation due to abnormal movements: None, normal Patient's awareness of abnormal movements (rate only patient's report): No Awareness, Dental Status Current problems with teeth and/or dentures?: No Does patient usually wear dentures?: No  CIWA:    COWS:     Musculoskeletal: Strength & Muscle Tone: within normal limits Gait & Station: normal Patient leans: N/A  Psychiatric Specialty Exam:  See MD SRA Physical Exam  Nursing note and vitals reviewed.   ROS  Blood pressure (!) 124/92, pulse (!) 110, temperature 97.7 F (36.5 C), temperature source Oral, resp. rate 18, height  (1.93 m), weight 117.9 kg (260 lb), SpO2 99 %.Body mass index is 31.65 kg/m.    Have you used any form of tobacco in the last 30 days? (Cigarettes, Smokeless Tobacco, Cigars, and/or Pipes): Yes  Has this patient used any form of tobacco in the last 30 days? (Cigarettes, Smokeless Tobacco, Cigars, and/or Pipes) Yes, N/A  Blood Alcohol level:  Lab Results  Component Value Date   ETH <5 08/28/2016    Metabolic Disorder Labs:  Lab Results  Component Value Date   HGBA1C 10.1 (H) 08/28/2016   MPG 243 08/28/2016   No results found for: PROLACTIN Lab Results  Component Value Date   CHOL  11/20/2006    146        ATP III CLASSIFICATION:  <200     mg/dL   Desirable  657-846  mg/dL   Borderline High  >=962    mg/dL   High   TRIG 952 84/13/2440   HDL 27 (L) 11/20/2006   CHOLHDL 5.4 11/20/2006   VLDL 26 11/20/2006   LDLCALC  11/20/2006    93        Total Cholesterol/HDL:CHD Risk Coronary Heart Disease Risk Table                     Men   Women  1/2 Average Risk   3.4   3.3    See Psychiatric Specialty Exam and  Suicide Risk Assessment completed by Attending Physician prior to discharge.  Discharge destination:  Home  Is patient on multiple antipsychotic therapies at discharge:  No   Has Patient had three or more failed trials of antipsychotic monotherapy by history:  No  Recommended Plan for Multiple Antipsychotic Therapies: NA   Allergies as of 09/06/2016   No Known Allergies     Medication List    STOP taking these medications   FLUoxetine 20 MG capsule Commonly known as:  PROZAC   insulin aspart 100 UNIT/ML injection Commonly known as:  novoLOG     TAKE these medications     Indication  DULoxetine 60 MG capsule Commonly known as:  CYMBALTA Take 1 capsule (60 mg total) by mouth daily. Start taking on:  09/07/2016  Indication:  Major Depressive Disorder   gabapentin 300 MG capsule Commonly known as:  NEURONTIN Take 1 capsule (300 mg total) by mouth 3 (three) times daily.  Indication:  Agitation   hydrOXYzine 25 MG tablet Commonly known as:  ATARAX/VISTARIL Take 1  tablet (25 mg total) by mouth every 6 (six) hours as needed for anxiety.  Indication:  Anxiety Neurosis   insulin glargine 100 UNIT/ML injection Commonly known as:  LANTUS Inject 0.4 mLs (40 Units total) into the skin at bedtime.  Indication:  Type 2 Diabetes   losartan 25 MG tablet Commonly known as:  COZAAR Take 1 tablet (25 mg total) by mouth daily. Start taking on:  09/07/2016  Indication:  High Blood Pressure Disorder   mirtazapine 7.5 MG tablet Commonly known as:  REMERON Take 1 tablet (7.5 mg total) by mouth at bedtime. What changed:  medication strength  how much to take  Indication:  Major Depressive Disorder   nicotine polacrilex 2 MG gum Commonly known as:  NICORETTE Take 1 each (2 mg total) by mouth as needed for smoking cessation.  Indication:  Nicotine Addiction      Follow-up Information    [facility] Follow up.   Specialty:  Behavioral Health Why:  Walk in within 7 days of  hospital discharge for hospital follow-up/medication management assessment. Walk in hours: 8am-9am Monday through Friday.  [address] [place] [address] [phone]        [facility] Follow up on 09/13/2016.   Specialty:  Behavioral Health Why:  Appt for therapy on Wednesday Sep 13, 2016 at 1:30PM with Theodoro Grist. Please call within 48 hours of appt if you have to cancel or reschedule. Thank you.  Contact information: [address] [place] [address] [phone]           Follow-up recommendations:  Activity:  as tol Diet:  as tol  Comments:  1.  Take all your medications as prescribed.   2.  Report any adverse side effects to outpatient provider. 3.  Patient instructed to not use alcohol or illegal drugs while on prescription medicines. 4.  In the event of worsening symptoms, instructed patient to call 911, the crisis hotline or go to nearest emergency room for evaluation of symptoms.  Signed: Lindwood Qua, NP [facility] 09/06/2016, 12:50 PM

## 2016-09-06 NOTE — Progress Notes (Signed)
Patient discharged per physician order; patient denies SI/HI and A/V hallucinations; patient received prescriptions, samples, AVS, suicide risk assessment note, and transition record given to the patient after it was reviewed; patient had no other questions or concerns at this time; patient verbalized and signed that all belongings were returned; patient left the unit ambulatory 

## 2016-09-06 NOTE — Tx Team (Signed)
Interdisciplinary Treatment and Diagnostic Plan Update  09/06/2016 Time of Session: 0930 Adrian Gonzalez MRN: [id number]  Principal Diagnosis: MDD  Secondary Diagnoses: Principal Problem:   MDD (major depressive disorder), recurrent severe, without psychosis (HCC)   Current Medications:  Current Facility-Administered Medications  Medication Dose Route Frequency Provider Last Rate Last Dose  . acetaminophen (TYLENOL) tablet 650 mg  650 mg Oral Q6H PRN Laveda Abbe, NP   650 mg at 09/05/16 2122  . alum & mag hydroxide-simeth (MAALOX/MYLANTA) 200-200-20 MG/5ML suspension 30 mL  30 mL Oral Q4H PRN Laveda Abbe, NP      . DULoxetine (CYMBALTA) DR capsule 60 mg  60 mg Oral Daily Georgiann Cocker, MD   60 mg at 09/06/16 0906  . gabapentin (NEURONTIN) capsule 300 mg  300 mg Oral TID Craige Cotta, MD   300 mg at 09/06/16 4098  . hydrOXYzine (ATARAX/VISTARIL) tablet 25 mg  25 mg Oral Q6H PRN Laveda Abbe, NP   25 mg at 09/03/16 2056  . insulin aspart (novoLOG) injection 0-20 Units  0-20 Units Subcutaneous TID WC Laveda Abbe, NP   11 Units at 09/06/16 [phone]  . insulin aspart (novoLOG) injection 5 Units  5 Units Subcutaneous TID WC Laveda Abbe, NP   5 Units at 09/06/16 [phone]  . insulin glargine (LANTUS) injection 35 Units  35 Units Subcutaneous QHS Laveda Abbe, NP   35 Units at 09/05/16 2123  . lidocaine (LIDODERM) 5 % 1 patch  1 patch Transdermal Daily Craige Cotta, MD   1 patch at 09/03/16 0859  . losartan (COZAAR) tablet 25 mg  25 mg Oral Daily Laveda Abbe, NP   25 mg at 09/06/16 9562  . magnesium hydroxide (MILK OF MAGNESIA) suspension 30 mL  30 mL Oral Daily PRN Laveda Abbe, NP      . mirtazapine (REMERON) tablet 7.5 mg  7.5 mg Oral QHS Craige Cotta, MD   7.5 mg at 09/05/16 2122  . multivitamin with minerals tablet 1 tablet  1 tablet Oral Daily Laveda Abbe, NP   1 tablet at 09/06/16 1308  . nicotine  polacrilex (NICORETTE) gum 2 mg  2 mg Oral PRN Jackelyn Poling, NP   2 mg at 09/06/16 0906   PTA Medications: Prescriptions Prior to Admission  Medication Sig Dispense Refill Last Dose  . FLUoxetine (PROZAC) 20 MG capsule Take 1 capsule (20 mg total) by mouth daily. 30 capsule 3   . insulin aspart (NOVOLOG) 100 UNIT/ML injection Inject 8 Units into the skin 3 (three) times daily with meals. 10 mL 11   . insulin aspart (NOVOLOG) 100 UNIT/ML injection Inject 0-9 Units into the skin at bedtime. 10 mL 11   . insulin aspart (NOVOLOG) 100 UNIT/ML injection Inject 0-20 Units into the skin 3 (three) times daily with meals. 10 mL 11   . insulin glargine (LANTUS) 100 UNIT/ML injection Inject 0.4 mLs (40 Units total) into the skin at bedtime. 10 mL 11   . losartan (COZAAR) 25 MG tablet Take 1 tablet (25 mg total) by mouth daily. 30 tablet 0   . mirtazapine (REMERON) 15 MG tablet Take 1 tablet (15 mg total) by mouth at bedtime. 30 tablet 0     Patient Stressors: Financial difficulties Health problems Legal issue Loss of significant other Substance abuse  Patient Strengths: Ability for insight Average or above average intelligence Communication skills Supportive family/friends  Treatment Modalities: Medication Management, Group therapy, Case  management,  1 to 1 session with clinician, Psychoeducation, Recreational therapy.   Physician Treatment Plan for Primary Diagnosis: MDD Long Term Goal(s): Improvement in symptoms so as ready for discharge Improvement in symptoms so as ready for discharge   Short Term Goals: Ability to verbalize feelings will improve Ability to disclose and discuss suicidal ideas Ability to demonstrate self-control will improve Ability to identify and develop effective coping behaviors will improve Ability to maintain clinical measurements within normal limits will improve Ability to verbalize feelings will improve Ability to disclose and discuss suicidal ideas Ability  to demonstrate self-control will improve Ability to identify and develop effective coping behaviors will improve Ability to maintain clinical measurements within normal limits will improve  Medication Management: Evaluate patient's response, side effects, and tolerance of medication regimen.  Therapeutic Interventions: 1 to 1 sessions, Unit Group sessions and Medication administration.  Evaluation of Outcomes: Adequate for Discharge  Physician Treatment Plan for Secondary Diagnosis: Principal Problem:   MDD (major depressive disorder), recurrent severe, without psychosis (HCC)  Long Term Goal(s): Improvement in symptoms so as ready for discharge Improvement in symptoms so as ready for discharge   Short Term Goals: Ability to verbalize feelings will improve Ability to disclose and discuss suicidal ideas Ability to demonstrate self-control will improve Ability to identify and develop effective coping behaviors will improve Ability to maintain clinical measurements within normal limits will improve Ability to verbalize feelings will improve Ability to disclose and discuss suicidal ideas Ability to demonstrate self-control will improve Ability to identify and develop effective coping behaviors will improve Ability to maintain clinical measurements within normal limits will improve     Medication Management: Evaluate patient's response, side effects, and tolerance of medication regimen.  Therapeutic Interventions: 1 to 1 sessions, Unit Group sessions and Medication administration.  Evaluation of Outcomes: Adequate for Discharge   RN Treatment Plan for Primary Diagnosis: MDD Long Term Goal(s): Knowledge of disease and therapeutic regimen to maintain health will improve  Short Term Goals: Ability to remain free from injury will improve, Ability to verbalize feelings will improve and Ability to disclose and discuss suicidal ideas  Medication Management: RN will administer medications  as ordered by provider, will assess and evaluate patient's response and provide education to patient for prescribed medication. RN will report any adverse and/or side effects to prescribing provider.  Therapeutic Interventions: 1 on 1 counseling sessions, Psychoeducation, Medication administration, Evaluate responses to treatment, Monitor vital signs and CBGs as ordered, Perform/monitor CIWA, COWS, AIMS and Fall Risk screenings as ordered, Perform wound care treatments as ordered.  Evaluation of Outcomes: Adequate for Discharge   LCSW Treatment Plan for Primary Diagnosis: MDDLong Term Goal(s): Safe transition to appropriate next level of care at discharge, Engage patient in therapeutic group addressing interpersonal concerns.  Short Term Goals: Engage patient in aftercare planning with referrals and resources, Facilitate patient progression through stages of change regarding substance use diagnoses and concerns and Identify triggers associated with mental health/substance abuse issues  Therapeutic Interventions: Assess for all discharge needs, 1 to 1 time with Social worker, Explore available resources and support systems, Assess for adequacy in community support network, Educate family and significant other(s) on suicide prevention, Complete Psychosocial Assessment, Interpersonal group therapy.  Evaluation of Outcomes: Adequate for Discharge   Progress in Treatment: Attending groups: No.  Participating in groups: No. Taking medication as prescribed: Yes. Toleration medication: Yes. Family/Significant other contact made: SPE completed with pt; pt declined to consent to family contact.  Patient understands diagnosis: Yes.  Discussing patient identified problems/goals with staff: Yes. Medical problems stabilized or resolved: Yes. Denies suicidal/homicidal ideation: Yes. Issues/concerns per patient self-inventory: No. Other: n/a   New problem(s) identified: No, Describe:  n/a  New Short  Term/Long Term Goal(s): medication stabilization; elimination of SI thoughts; development of comprehensive mental wellness/sobriety plan.   Discharge Plan or Barriers: Pt plans to return home with his mother; follow-up for medication management at [facility] and therapy at [facility].   Reason for Continuation of Hospitalization:  None identified at this time.   Estimated Length of Stay: 0 days   Attendees: Patient: 09/06/2016 9:37 AM  Physician: Dr. Jama Flavors MD 09/06/2016 9:37 AM  Nursing: Corwin Levins RN 09/06/2016 9:37 AM  RN Care Manager: Onnie Boer CM 09/06/2016 9:37 AM  Social Worker: Vernie Shanks, LCSW 09/06/2016 9:37 AM  Recreational Therapist: Juliann Pares 09/06/2016 9:37 AM  Other: Armandina Stammer NP; Patria Mane, NP 09/06/2016 9:37 AM  Other:  09/06/2016 9:37 AM  Other: 09/06/2016 9:37 AM    Scribe for Treatment Team: Verdene Lennert, LCSW 09/06/2016 9:37 AM

## 2017-10-12 IMAGING — CT CT HEAD W/O CM
3 series · 15 of 47 positions shown, 18 images · non-contrast
Comparison: None.

CLINICAL DATA: Status post fall, with injury at the right zygoma.
Hyperglycemia. Initial encounter.

EXAM:
CT HEAD WITHOUT CONTRAST
TECHNIQUE: Contiguous axial images were obtained from the base of the skull
through the vertex without intravenous contrast.

[Series 2: head wo · axial · 0.47mm/px · z∈[-126,+4]mm · 9 of 32 slices shown, 12 images]
[im 3/32  brain]
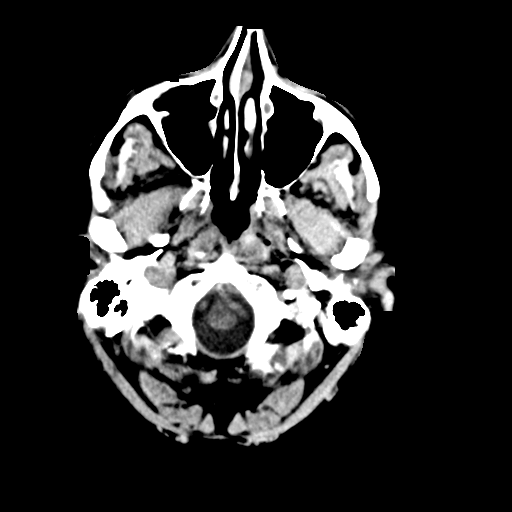
[im 3/32  bone]
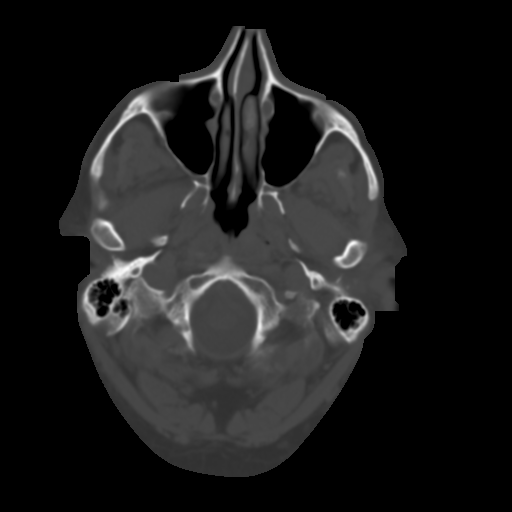
[im 6/32  brain]
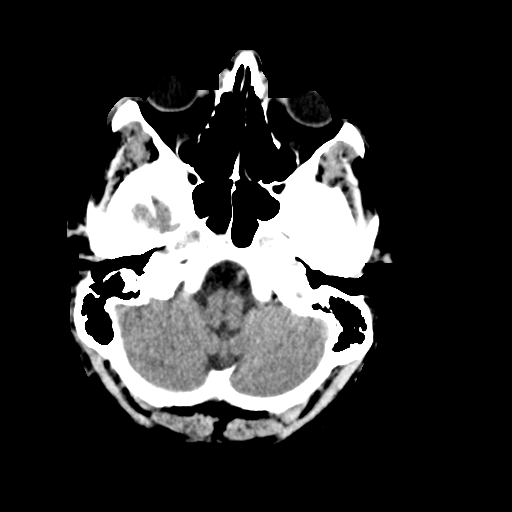
[im 9/32  brain]
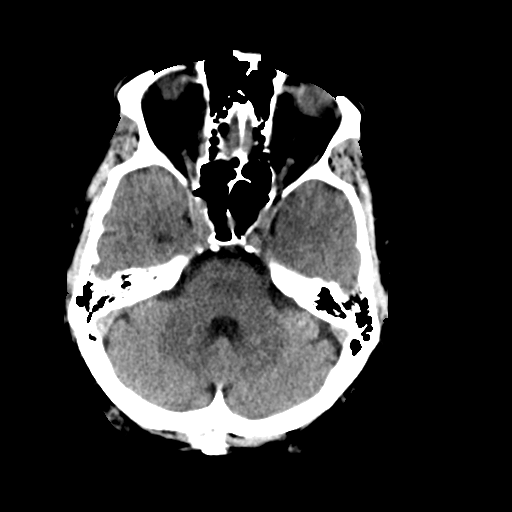
[im 12/32  brain]
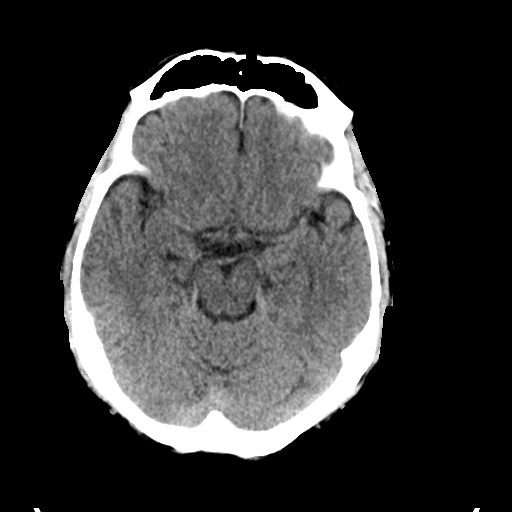
[im 17/32  brain]
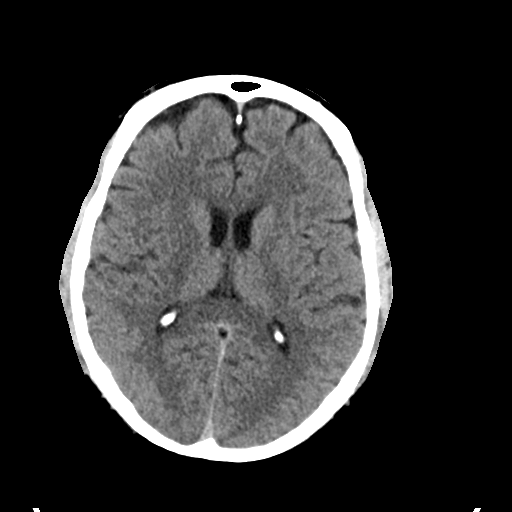
[im 17/32  bone]
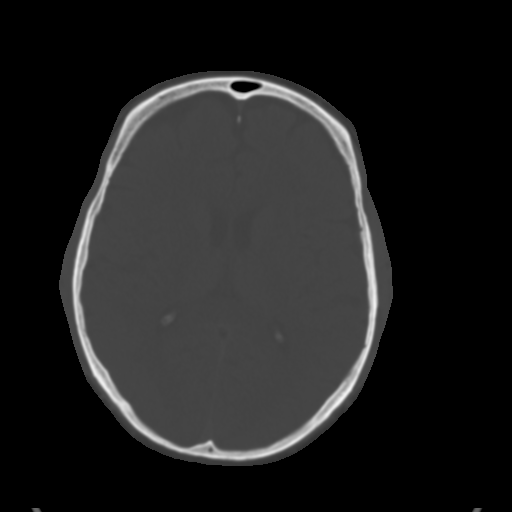
[im 20/32  brain]
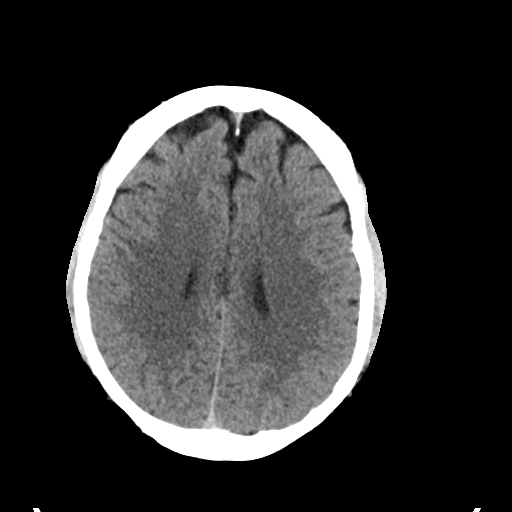
[im 23/32  brain]
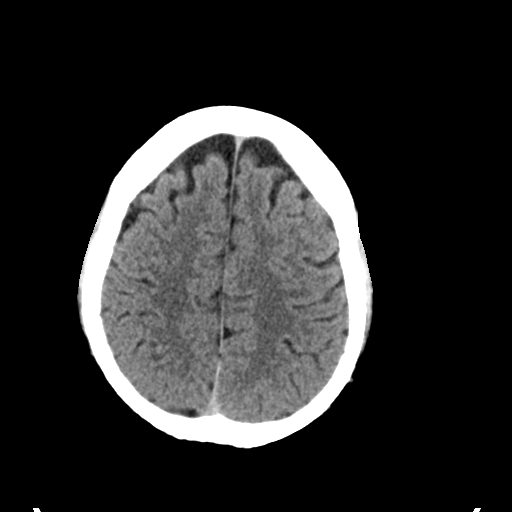
[im 26/32  brain]
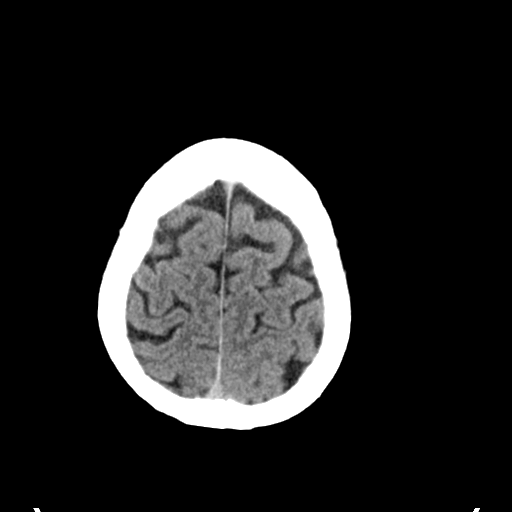
[im 29/32  brain]
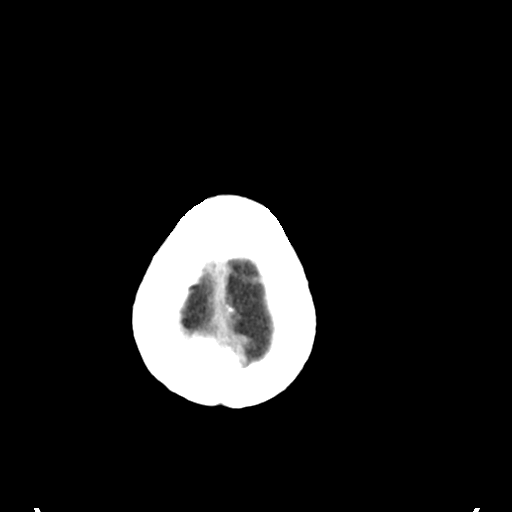
[im 29/32  bone]
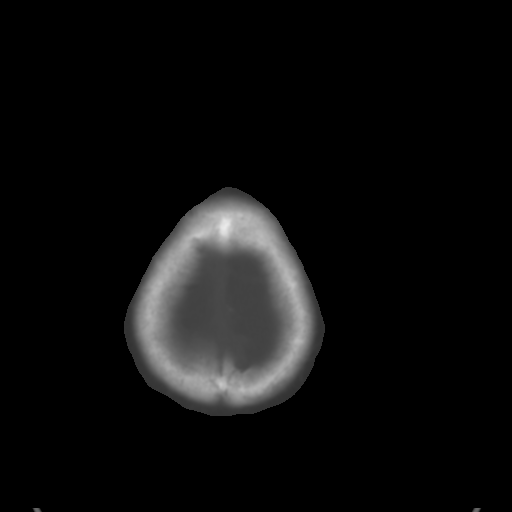

[Series 4: coronal soft tissue · coronal · 0.31mm/px · 3 of 67 slices shown]
[im 23/67  brain]
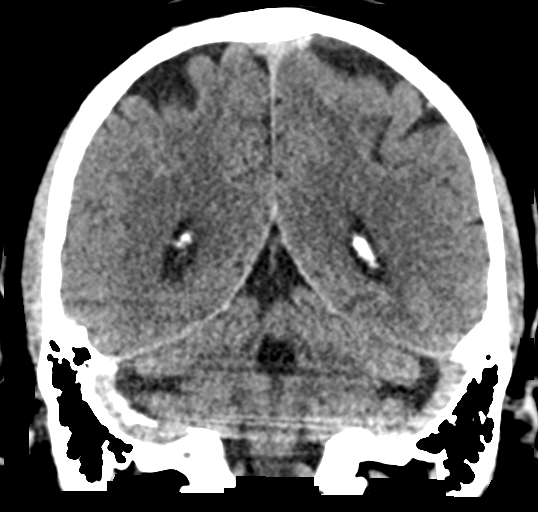
[im 30/67  brain]
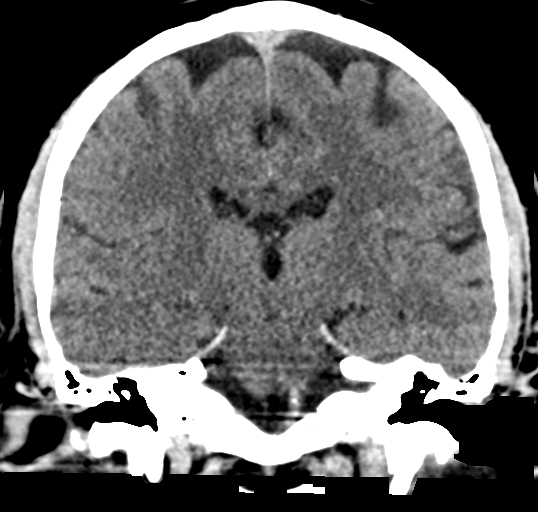
[im 37/67  brain]
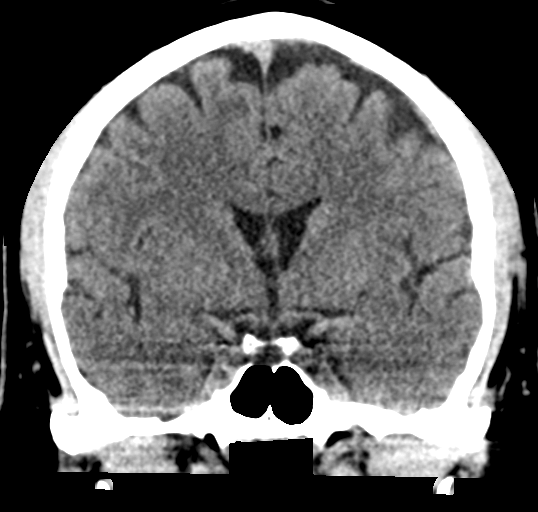

[Series 5: sagittal soft tissue · sagittal · 0.30mm/px · 3 of 55 slices shown]
[im 19/55  brain]
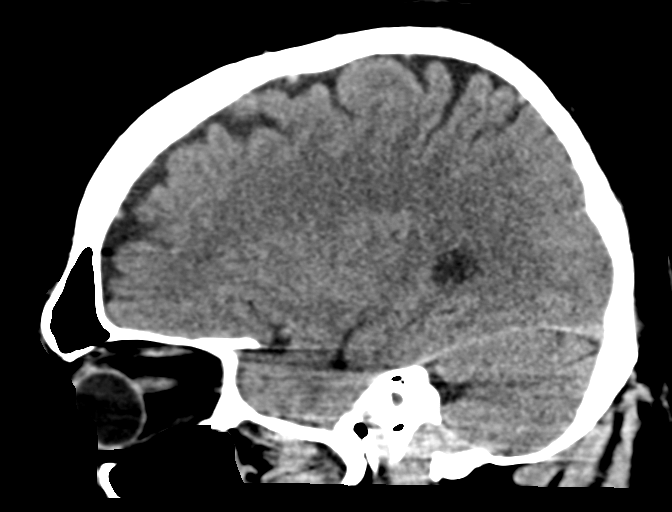
[im 28/55  brain]
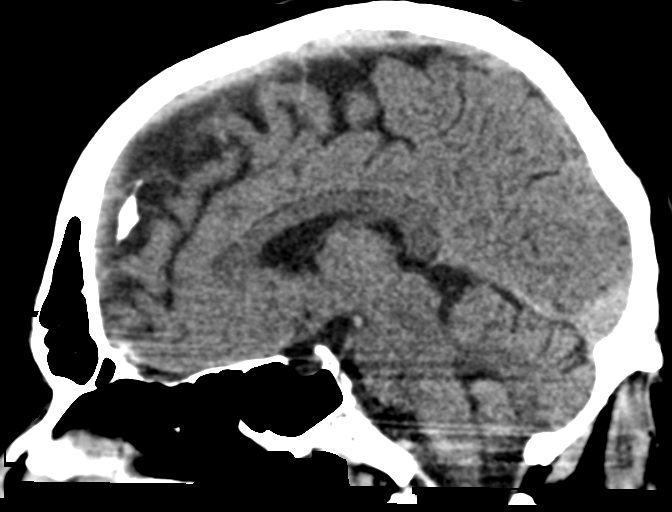
[im 37/55  brain]
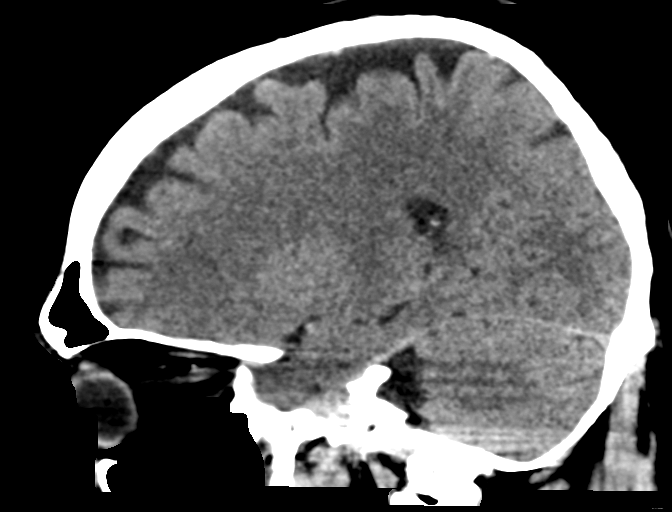

[15 of 47 positions shown; findings below may reference images not displayed]

FINDINGS: Brain: No evidence of acute infarction, hemorrhage, hydrocephalus,
extra-axial collection or mass lesion/mass effect.

Prominence of the sulci suggests mild cortical volume loss. Mild
cerebellar atrophy is noted.

The brainstem and fourth ventricle are within normal limits. The
basal ganglia are unremarkable in appearance. The cerebral
hemispheres demonstrate grossly normal gray-white differentiation.
No mass effect or midline shift is seen.

Vascular: No hyperdense vessel or unexpected calcification.

Skull: There is no evidence of fracture; visualized osseous
structures are unremarkable in appearance.

Sinuses/Orbits: The orbits are within normal limits. The paranasal
sinuses and mastoid air cells are well-aerated.

Other: Mild soft tissue swelling is noted overlying the right
zygomatic arch.
IMPRESSION: 1. No evidence of traumatic intracranial injury or fracture.
2. Mild soft tissue swelling overlying the right zygomatic arch.
3. Mild cortical volume loss noted.

## 2021-12-07 ENCOUNTER — Ambulatory Visit (INDEPENDENT_AMBULATORY_CARE_PROVIDER_SITE_OTHER): Payer: BC Managed Care – PPO | Admitting: Podiatry

## 2021-12-07 ENCOUNTER — Encounter: Payer: Self-pay | Admitting: Podiatry

## 2021-12-07 DIAGNOSIS — M21612 Bunion of left foot: Secondary | ICD-10-CM

## 2021-12-07 DIAGNOSIS — E1110 Type 2 diabetes mellitus with ketoacidosis without coma: Secondary | ICD-10-CM | POA: Diagnosis not present

## 2021-12-07 DIAGNOSIS — M21611 Bunion of right foot: Secondary | ICD-10-CM

## 2021-12-07 DIAGNOSIS — M2042 Other hammer toe(s) (acquired), left foot: Secondary | ICD-10-CM

## 2021-12-07 DIAGNOSIS — M2041 Other hammer toe(s) (acquired), right foot: Secondary | ICD-10-CM | POA: Diagnosis not present

## 2021-12-07 DIAGNOSIS — R52 Pain, unspecified: Secondary | ICD-10-CM | POA: Diagnosis not present

## 2021-12-07 DIAGNOSIS — L84 Corns and callosities: Secondary | ICD-10-CM

## 2021-12-07 DIAGNOSIS — M2012 Hallux valgus (acquired), left foot: Secondary | ICD-10-CM | POA: Diagnosis not present

## 2021-12-07 DIAGNOSIS — M2011 Hallux valgus (acquired), right foot: Secondary | ICD-10-CM | POA: Diagnosis not present

## 2021-12-07 MED ORDER — TRAMADOL HCL 50 MG PO TABS: 50.0000 mg | ORAL_TABLET | Freq: Two times a day (BID) | ORAL | 0 refills | Status: AC | PRN

## 2021-12-11 NOTE — Progress Notes (Signed)
  Subjective:  Patient ID: Adrian Gonzalez, male    DOB: 22-Jan-1966,  MRN: [id number]  Chief Complaint  Patient presents with   Nail Problem    (New Patient) Diagnosis: Toe pain, onychomycosis, corn on left toe(5th) Referring Provider: Lance Bosch   Diabetes    At risk diabetic foot care, A1C  10.1   Callouses    Toe pain, corn on left toe(5th) lateral- extremely sore    56 y.o. male presents with the above complaint. History confirmed with patient. Corns are quite painful  Objective:  Physical Exam: warm, good capillary refill, no trophic changes or ulcerative lesions, normal DP and PT pulses, normal sensory exam, and bilateral hallux valgus and hammertoes, he has calluses tip of the 5th toe L, 2nd toe R, 5th PIPJ R, tip L hallux  Assessment:   1. Hammertoe of left foot   2. Hammertoe of right foot   3. Hallux valgus with bunions, left   4. Hallux valgus with bunions, right   5. Callus of foot   6. Pain      Plan:  Patient was evaluated and treated and all questions answered.  Patient educated on diabetes. Discussed proper diabetic foot care and discussed risks and complications of disease. Educated patient in depth on reasons to return to the office immediately should he/she discover anything concerning or new on the feet. All questions answered. Discussed proper shoes as well.   All symptomatic hyperkeratoses were safely debrided with a sterile #15 blade to patient's level of comfort without incident. We discussed preventative and palliative care of these lesions including supportive and accommodative shoegear, padding, prefabricated and custom molded accommodative orthoses, use of a pumice stone and lotions/creams daily.   The calluses are quite painful for him. I did rx tramadol one time fo alleviate pain from his deformities.   Return if symptoms worsen or fail to improve.

## 2022-11-15 ENCOUNTER — Inpatient Hospital Stay
Admission: EM | Admit: 2022-11-15 | Discharge: 2022-11-17 | DRG: 617 | Disposition: A | Discharge status: Home / Self Care | Payer: BC Managed Care – PPO | Attending: Internal Medicine | Admitting: Internal Medicine

## 2022-11-15 ENCOUNTER — Emergency Department: Payer: BC Managed Care – PPO

## 2022-11-15 ENCOUNTER — Encounter: Payer: Self-pay | Admitting: Radiology

## 2022-11-15 ENCOUNTER — Other Ambulatory Visit: Payer: Self-pay

## 2022-11-15 DIAGNOSIS — L02611 Cutaneous abscess of right foot: Secondary | ICD-10-CM | POA: Diagnosis not present

## 2022-11-15 DIAGNOSIS — E1110 Type 2 diabetes mellitus with ketoacidosis without coma: Secondary | ICD-10-CM | POA: Diagnosis present

## 2022-11-15 DIAGNOSIS — Z794 Long term (current) use of insulin: Secondary | ICD-10-CM

## 2022-11-15 DIAGNOSIS — Z720 Tobacco use: Secondary | ICD-10-CM

## 2022-11-15 DIAGNOSIS — L03031 Cellulitis of right toe: Secondary | ICD-10-CM | POA: Diagnosis not present

## 2022-11-15 DIAGNOSIS — M86671 Other chronic osteomyelitis, right ankle and foot: Secondary | ICD-10-CM | POA: Diagnosis present

## 2022-11-15 DIAGNOSIS — L0390 Cellulitis, unspecified: Secondary | ICD-10-CM | POA: Diagnosis present

## 2022-11-15 DIAGNOSIS — L97516 Non-pressure chronic ulcer of other part of right foot with bone involvement without evidence of necrosis: Secondary | ICD-10-CM | POA: Diagnosis present

## 2022-11-15 DIAGNOSIS — E11621 Type 2 diabetes mellitus with foot ulcer: Secondary | ICD-10-CM | POA: Diagnosis present

## 2022-11-15 DIAGNOSIS — F32A Depression, unspecified: Secondary | ICD-10-CM | POA: Diagnosis present

## 2022-11-15 DIAGNOSIS — E785 Hyperlipidemia, unspecified: Secondary | ICD-10-CM | POA: Diagnosis present

## 2022-11-15 DIAGNOSIS — Z79899 Other long term (current) drug therapy: Secondary | ICD-10-CM

## 2022-11-15 DIAGNOSIS — E1169 Type 2 diabetes mellitus with other specified complication: Principal | ICD-10-CM | POA: Diagnosis present

## 2022-11-15 DIAGNOSIS — E11628 Type 2 diabetes mellitus with other skin complications: Secondary | ICD-10-CM | POA: Diagnosis present

## 2022-11-15 DIAGNOSIS — E1152 Type 2 diabetes mellitus with diabetic peripheral angiopathy with gangrene: Secondary | ICD-10-CM | POA: Diagnosis present

## 2022-11-15 DIAGNOSIS — Z7984 Long term (current) use of oral hypoglycemic drugs: Secondary | ICD-10-CM

## 2022-11-15 DIAGNOSIS — L089 Local infection of the skin and subcutaneous tissue, unspecified: Principal | ICD-10-CM

## 2022-11-15 DIAGNOSIS — I10 Essential (primary) hypertension: Secondary | ICD-10-CM | POA: Diagnosis present

## 2022-11-15 DIAGNOSIS — M869 Osteomyelitis, unspecified: Secondary | ICD-10-CM | POA: Diagnosis present

## 2022-11-15 DIAGNOSIS — M79671 Pain in right foot: Secondary | ICD-10-CM | POA: Diagnosis not present

## 2022-11-15 LAB — HEPATIC FUNCTION PANEL
ALT: 34 U/L (ref 0–44)
AST: 23 U/L (ref 15–41)
Albumin: 4.5 g/dL (ref 3.5–5.0)
Alkaline Phosphatase: 53 U/L (ref 38–126)
Bilirubin, Direct: 0.2 mg/dL (ref 0.0–0.2)
Indirect Bilirubin: 1.2 mg/dL — ABNORMAL HIGH (ref 0.3–0.9)
Total Bilirubin: 1.4 mg/dL — ABNORMAL HIGH (ref 0.3–1.2)
Total Protein: 7.2 g/dL (ref 6.5–8.1)

## 2022-11-15 LAB — CBC WITH DIFFERENTIAL/PLATELET
Abs Immature Granulocytes: 0.03 10*3/uL (ref 0.00–0.07)
Basophils Absolute: 0 10*3/uL (ref 0.0–0.1)
Basophils Relative: 0 %
Eosinophils Absolute: 0.1 10*3/uL (ref 0.0–0.5)
Eosinophils Relative: 1 %
HCT: 38.5 % — ABNORMAL LOW (ref 39.0–52.0)
Hemoglobin: 13.1 g/dL (ref 13.0–17.0)
Immature Granulocytes: 0 %
Lymphocytes Relative: 19 %
Lymphs Abs: 1.3 10*3/uL (ref 0.7–4.0)
MCH: 31 pg (ref 26.0–34.0)
MCHC: 34 g/dL (ref 30.0–36.0)
MCV: 91 fL (ref 80.0–100.0)
Monocytes Absolute: 0.6 10*3/uL (ref 0.1–1.0)
Monocytes Relative: 9 %
Neutro Abs: 4.9 10*3/uL (ref 1.7–7.7)
Neutrophils Relative %: 71 %
Platelets: 204 10*3/uL (ref 150–400)
RBC: 4.23 MIL/uL (ref 4.22–5.81)
RDW: 12.4 % (ref 11.5–15.5)
WBC: 7 10*3/uL (ref 4.0–10.5)
nRBC: 0 % (ref 0.0–0.2)

## 2022-11-15 LAB — LACTIC ACID, PLASMA: Lactic Acid, Venous: 1.7 mmol/L (ref 0.5–1.9)

## 2022-11-15 LAB — BASIC METABOLIC PANEL
Anion gap: 11 (ref 5–15)
BUN: 24 mg/dL — ABNORMAL HIGH (ref 6–20)
CO2: 22 mmol/L (ref 22–32)
Calcium: 9.3 mg/dL (ref 8.9–10.3)
Chloride: 105 mmol/L (ref 98–111)
Creatinine, Ser: 0.92 mg/dL (ref 0.61–1.24)
GFR, Estimated: >60 mL/min (ref >60)
Glucose, Bld: 167 mg/dL — ABNORMAL HIGH (ref 70–99)
Potassium: 3.9 mmol/L (ref 3.5–5.1)
Sodium: 138 mmol/L (ref 135–145)

## 2022-11-15 LAB — CBG MONITORING, ED: Glucose-Capillary: 133 mg/dL — ABNORMAL HIGH (ref 70–99)

## 2022-11-15 MED ORDER — HEPARIN SODIUM (PORCINE) 5000 UNIT/ML IJ SOLN
5000.0000 [IU] | Freq: Three times a day (TID) | INTRAMUSCULAR | Status: DC
  Administered 2022-11-15 – 2022-11-17 (×6): 5000 [IU] via SUBCUTANEOUS
  Filled 2022-11-15 (×6): qty 1

## 2022-11-15 MED ORDER — HYDROCODONE-ACETAMINOPHEN 5-325 MG PO TABS
2.0000 | ORAL_TABLET | Freq: Once | ORAL | Status: AC
  Administered 2022-11-15: 2 via ORAL
  Filled 2022-11-15: qty 2

## 2022-11-15 MED ORDER — SODIUM CHLORIDE 0.9 % IV SOLN
2.0000 g | Freq: Once | INTRAVENOUS | Status: AC
  Administered 2022-11-15: 2 g via INTRAVENOUS
  Filled 2022-11-15: qty 12.5

## 2022-11-15 MED ORDER — ASPIRIN 81 MG PO TBEC
81.0000 mg | DELAYED_RELEASE_TABLET | Freq: Every day | ORAL | Status: DC
  Administered 2022-11-15 – 2022-11-16 (×2): 81 mg via ORAL
  Filled 2022-11-15 (×2): qty 1

## 2022-11-15 MED ORDER — LACTATED RINGERS IV SOLN: INTRAVENOUS | Status: AC

## 2022-11-15 MED ORDER — VANCOMYCIN HCL 500 MG/100ML IV SOLN
500.0000 mg | Freq: Once | INTRAVENOUS | Status: AC
  Administered 2022-11-15: 500 mg via INTRAVENOUS
  Filled 2022-11-15: qty 100

## 2022-11-15 MED ORDER — INSULIN ASPART 100 UNIT/ML IJ SOLN
0.0000 [IU] | Freq: Three times a day (TID) | INTRAMUSCULAR | Status: DC
  Administered 2022-11-15 – 2022-11-17 (×6): 2 [IU] via SUBCUTANEOUS
  Filled 2022-11-15 (×6): qty 1

## 2022-11-15 MED ORDER — SODIUM CHLORIDE 0.9 % IV SOLN
2.0000 g | INTRAVENOUS | Status: DC
  Administered 2022-11-16 – 2022-11-17 (×2): 2 g via INTRAVENOUS
  Filled 2022-11-15 (×2): qty 20

## 2022-11-15 MED ORDER — VANCOMYCIN HCL 1500 MG/300ML IV SOLN
1500.0000 mg | Freq: Two times a day (BID) | INTRAVENOUS | Status: DC
  Filled 2022-11-15: qty 300

## 2022-11-15 MED ORDER — VANCOMYCIN HCL 2000 MG/400ML IV SOLN
2000.0000 mg | Freq: Once | INTRAVENOUS | Status: AC
  Administered 2022-11-15: 2000 mg via INTRAVENOUS
  Filled 2022-11-15 (×2): qty 400

## 2022-11-15 MED ORDER — VANCOMYCIN HCL 1500 MG/300ML IV SOLN
1500.0000 mg | Freq: Two times a day (BID) | INTRAVENOUS | Status: DC
  Administered 2022-11-16 – 2022-11-17 (×3): 1500 mg via INTRAVENOUS
  Filled 2022-11-15 (×4): qty 300

## 2022-11-15 MED ORDER — INSULIN ASPART 100 UNIT/ML IJ SOLN: 0.0000 [IU] | Freq: Three times a day (TID) | INTRAMUSCULAR | Status: DC
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
## 2022-11-15 NOTE — ED Notes (Signed)
Patient going up to admitted room at this time.

## 2022-11-15 NOTE — Progress Notes (Signed)
Pharmacy Antibiotic Note  Adrian Gonzalez is a 57 y.o. male admitted on 11/15/2022 with cellulitis.  Pharmacy has been consulted for Vancomycin dosing.  Plan: Vancomycin 2 gm IV X 1 given in ED on 7/3 @ 2021.  Additional Vanc 500 mg IV X 1 ordered to make total loading dose of 2500 mg.  Vancomycin 1500 mg IV Q12H X 7 days ordered to start on 7/4 @ 0800.  AUC = 537.5 Vanc trough = 13.9   Height: 6\' 4"  (193 cm) Weight: 117.9 kg (260 lb) IBW/kg (Calculated) : 86.8  Temp (24hrs), Avg:97.9 F (36.6 C), Min:97.7 F (36.5 C), Max:98.2 F (36.8 C)  Recent Labs  Lab 11/15/22 1810 11/15/22 1950  WBC 7.0  --   CREATININE 0.92  --   LATICACIDVEN  --  1.7    Estimated Creatinine Clearance: 124.3 mL/min (by C-G formula based on SCr of 0.92 mg/dL).    No Known Allergies  Antimicrobials this admission:   >>    >>   Dose adjustments this admission:   Microbiology results:  BCx:   UCx:    Sputum:    MRSA PCR:   Thank you for allowing pharmacy to be a part of this patient's care.  Teryl Mcconaghy D 11/15/2022 11:08 PM

## 2022-11-15 NOTE — Progress Notes (Signed)
PHARMACY -  BRIEF ANTIBIOTIC NOTE   Pharmacy has received consult(s) for Cefepime & Vancomycin from an ED provider.  The patient's profile has been reviewed for ht/wt/allergies/indication/available labs.    One time order(s) placed for Cefepime 2 gm & Vancomycin 2 gm per pt wt 117.9 kg.  Further antibiotics/pharmacy consults should be ordered by admitting physician if indicated.                       Thank you, Otelia Sergeant, PharmD, [facility] 11/15/2022 7:51 PM
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
## 2022-11-16 ENCOUNTER — Observation Stay: Payer: BC Managed Care – PPO

## 2022-11-16 DIAGNOSIS — E785 Hyperlipidemia, unspecified: Secondary | ICD-10-CM | POA: Diagnosis present

## 2022-11-16 DIAGNOSIS — L97516 Non-pressure chronic ulcer of other part of right foot with bone involvement without evidence of necrosis: Secondary | ICD-10-CM | POA: Diagnosis present

## 2022-11-16 DIAGNOSIS — L02611 Cutaneous abscess of right foot: Secondary | ICD-10-CM | POA: Diagnosis present

## 2022-11-16 DIAGNOSIS — E1169 Type 2 diabetes mellitus with other specified complication: Secondary | ICD-10-CM

## 2022-11-16 DIAGNOSIS — M79671 Pain in right foot: Secondary | ICD-10-CM | POA: Diagnosis present

## 2022-11-16 DIAGNOSIS — F32A Depression, unspecified: Secondary | ICD-10-CM | POA: Diagnosis present

## 2022-11-16 DIAGNOSIS — M869 Osteomyelitis, unspecified: Secondary | ICD-10-CM

## 2022-11-16 DIAGNOSIS — E11628 Type 2 diabetes mellitus with other skin complications: Secondary | ICD-10-CM | POA: Diagnosis present

## 2022-11-16 DIAGNOSIS — E1152 Type 2 diabetes mellitus with diabetic peripheral angiopathy with gangrene: Secondary | ICD-10-CM | POA: Diagnosis present

## 2022-11-16 DIAGNOSIS — Z720 Tobacco use: Secondary | ICD-10-CM | POA: Diagnosis not present

## 2022-11-16 DIAGNOSIS — Z79899 Other long term (current) drug therapy: Secondary | ICD-10-CM | POA: Diagnosis not present

## 2022-11-16 DIAGNOSIS — I10 Essential (primary) hypertension: Secondary | ICD-10-CM | POA: Diagnosis present

## 2022-11-16 DIAGNOSIS — M86671 Other chronic osteomyelitis, right ankle and foot: Secondary | ICD-10-CM | POA: Diagnosis present

## 2022-11-16 DIAGNOSIS — Z7984 Long term (current) use of oral hypoglycemic drugs: Secondary | ICD-10-CM | POA: Diagnosis not present

## 2022-11-16 DIAGNOSIS — E1110 Type 2 diabetes mellitus with ketoacidosis without coma: Secondary | ICD-10-CM | POA: Diagnosis present

## 2022-11-16 DIAGNOSIS — E11621 Type 2 diabetes mellitus with foot ulcer: Secondary | ICD-10-CM | POA: Diagnosis present

## 2022-11-16 DIAGNOSIS — L03031 Cellulitis of right toe: Secondary | ICD-10-CM | POA: Diagnosis present

## 2022-11-16 DIAGNOSIS — Z794 Long term (current) use of insulin: Secondary | ICD-10-CM | POA: Diagnosis not present

## 2022-11-16 LAB — BLOOD CULTURE ID PANEL (REFLEXED) - BCID2

## 2022-11-16 LAB — PROTIME-INR
INR: 1.1 (ref 0.8–1.2)
Prothrombin Time: 14.2 seconds (ref 11.4–15.2)

## 2022-11-16 LAB — PROCALCITONIN: Procalcitonin: <0.1 ng/mL

## 2022-11-16 LAB — HIV ANTIBODY (ROUTINE TESTING W REFLEX): HIV Screen 4th Generation wRfx: NONREACTIVE

## 2022-11-16 LAB — GLUCOSE, CAPILLARY
Glucose-Capillary: 137 mg/dL — ABNORMAL HIGH (ref 70–99)
Glucose-Capillary: 149 mg/dL — ABNORMAL HIGH (ref 70–99)
Glucose-Capillary: 127 mg/dL — ABNORMAL HIGH (ref 70–99)
Glucose-Capillary: 200 mg/dL — ABNORMAL HIGH (ref 70–99)

## 2022-11-16 LAB — HEMOGLOBIN A1C
Hgb A1c MFr Bld: 6.9 % — ABNORMAL HIGH (ref 4.8–5.6)
Mean Plasma Glucose: 151.33 mg/dL

## 2022-11-16 LAB — CULTURE, BLOOD (ROUTINE X 2)

## 2022-11-16 LAB — CORTISOL-AM, BLOOD: Cortisol - AM: 3.4 ug/dL — ABNORMAL LOW (ref 6.7–22.6)

## 2022-11-16 MED ORDER — CHLORHEXIDINE GLUCONATE CLOTH 2 % EX PADS
6.0000 | MEDICATED_PAD | Freq: Once | CUTANEOUS | Status: AC
  Administered 2022-11-16: 6 via TOPICAL

## 2022-11-16 MED ORDER — BUPROPION HCL ER (XL) 150 MG PO TB24
300.0000 mg | ORAL_TABLET | Freq: Every day | ORAL | Status: DC
  Administered 2022-11-16: 300 mg via ORAL
  Filled 2022-11-16: qty 2

## 2022-11-16 MED ORDER — HYDROCODONE-ACETAMINOPHEN 5-325 MG PO TABS
1.0000 | ORAL_TABLET | ORAL | Status: DC | PRN
  Administered 2022-11-16 – 2022-11-17 (×5): 1 via ORAL
  Filled 2022-11-16 (×5): qty 1

## 2022-11-16 MED ORDER — CYCLOBENZAPRINE HCL 10 MG PO TABS
5.0000 mg | ORAL_TABLET | Freq: Once | ORAL | Status: AC
  Administered 2022-11-16: 5 mg via ORAL
  Filled 2022-11-16: qty 1

## 2022-11-16 MED ORDER — ATORVASTATIN CALCIUM 10 MG PO TABS
10.0000 mg | ORAL_TABLET | Freq: Every day | ORAL | Status: DC
  Administered 2022-11-16: 10 mg via ORAL
  Filled 2022-11-16: qty 1

## 2022-11-16 NOTE — Assessment & Plan Note (Addendum)
Patient does not meet sepsis criteria in the emergency room. We will admit patient to MedSurg unit. Start patient on broad-spectrum antibiotics for MRSA gram-positive gram-negative and anaerobic coverage due to history of diabetes. Podiatry consulted Dr. Allena Katz to follow. N.p.o. after midnight. Wound care.  Abnormal x-ray we will follow-up with an MRI of the right foot. Physical therapy as deemed appropriate and an additional DME per podiatry.

## 2022-11-16 NOTE — Plan of Care (Signed)
  Problem: Education: Goal: Ability to describe self-care measures that may prevent or decrease complications (Diabetes Survival Skills Education) will improve Outcome: Progressing   Problem: Coping: Goal: Ability to adjust to condition or change in health will improve Outcome: Progressing   Problem: Metabolic: Goal: Ability to maintain appropriate glucose levels will improve Outcome: Progressing   Problem: Nutritional: Goal: Maintenance of adequate nutrition will improve Outcome: Progressing   Problem: Skin Integrity: Goal: Risk for impaired skin integrity will decrease Outcome: Progressing   

## 2022-11-16 NOTE — TOC Progression Note (Signed)
Transition of Care [facility]) - Progression Note    Patient Details  Name: Adrian Gonzalez MRN: [id number] Date of Birth: 1965/08/16  [facility] of [facility]) CM/SW Contact  Marlowe Sax, RN Phone Number: 11/16/2022, 10:36 AM  Clinical Narrative:     TOC to follow for DC planning and needs, [facility] is PCP, Surgery planned for tomorrow to amputate toe   Expected Discharge Plan: Home/Self Care    Expected Discharge Plan and Services       Living arrangements for the past 2 months: Single Family Home                                       Social Determinants of Health (SDOH) Interventions SDOH Screenings   Food Insecurity: No Food Insecurity (11/15/2022)  Housing: Low Risk  (11/15/2022)  Transportation Needs: No Transportation Needs (11/15/2022)  Utilities: Not At Risk (11/15/2022)  Alcohol Screen: Low Risk  (03/21/2017)  Tobacco Use: High Risk (11/15/2022)    Readmission Risk Interventions     No data to display
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
## 2022-11-16 NOTE — Consult Note (Signed)
Subjective:  Patient ID: Adrian Gonzalez, male    DOB: [birth year],  MRN: [id number]  Patient with past medical history of type 2 DM and HTN seen at beside today for concern of right second toe pain and infection. Relates on Sunday he was working and had to stand in knee deep storm water and sludge. Relates starting to have pain in his toe after this and a couple days ago noticed redness pain and drainage in the toe. He was seen by PCP and advised to go tot he ED for further work up. .   Past Medical History:  Diagnosis Date   Diabetes mellitus without complication (HCC)    Hypertension      Past Surgical History:  Procedure Laterality Date   BACK SURGERY     KNEE SURGERY         Latest Ref Rng & Units 11/15/2022    6:10 PM 08/29/2016    3:03 PM 08/28/2016    2:11 PM  CBC  WBC 4.0 - 10.5 K/uL 7.0  6.2    Hemoglobin 13.0 - 17.0 g/dL 40.9  81.1  91.4   Hematocrit 39.0 - 52.0 % 38.5  41.7  45.0   Platelets 150 - 400 K/uL 204  187         Latest Ref Rng & Units 11/15/2022    6:10 PM 08/30/2016    3:24 AM 08/29/2016    3:03 PM  BMP  Glucose 70 - 99 mg/dL 782  956  213   BUN 6 - 20 mg/dL 24  11  13    Creatinine 0.61 - 1.24 mg/dL 0.86  5.78  4.69   Sodium 135 - 145 mmol/L 138  138  135   Potassium 3.5 - 5.1 mmol/L 3.9  3.4  3.9   Chloride 98 - 111 mmol/L 105  109  104   CO2 22 - 32 mmol/L 22  23  24    Calcium 8.9 - 10.3 mg/dL 9.3  8.8  8.9      Objective:   Vitals:   11/15/22 2257 11/16/22 0739  BP: (!) 151/79 136/79  Pulse: 86 73  Resp: 19 17  Temp: 97.7 F (36.5 C) 98.9 F (37.2 C)  SpO2: 99% 99%    General:AA&O x 3. Normal mood and affect   Vascular: DP and PT pulses 2/4 bilateral. Brisk capillary refill to all digits. Pedal hair present   Neruological. Epicritic sensation grossly intact.   Derm: distal right second toe wound with fibrotic necrotic  base Does probe to bone and some purulence noted with erythema extending to the MPJ. . Interspaces clears of  maceration. Nails well groomed and normal in appearance  MSK: MMT 5/5 in dorsiflexion, plantar flexion, inversion and eversion. Normal joint ROM without pain or crepitus.         MRI Right foot  IMPRESSION: 1. Marrow edema in the first distal phalanx with mild surrounding soft tissue edema concerning for osteomyelitis versus less likely osseous contusion. 2. Marrow edema in the second distal phalanx with a with surrounding soft tissue edema concerning for osteomyelitis. 3. 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 Small first MTP joint effusion. An infectious etiology is considered less likely.   Assessment & Plan:  Patient was evaluated and treated and all questions  answered.  DX: Right second digit osteomyelitis  Wound care: betadine, DSD  Antibiotics: Continue IV abx DME: post-op shoe with heel weight bearing   Discussed with patient diagnosis and treatment options.  Imaging reviewed. MRI with concern for osteomyeltiis of the second digit. Did also read possible osteo of the hallux however feel clinically there does not appear to be any infection. Discussed given clinical symptoms and MRI results on the second toe patient would be best treated with a right second partial digit amputation. Patient understandably concerned but in agreement with the plan and all questions answered.  Did order ABI to evaluate prior to surgery however pulses palpable.  Discussed surgery for tomorrow morning and will be NPO after midnight.   Patient in agreement with plan and all questions answered.   Louann Sjogren, DPM  Accessible via secure chat for questions or concerns.

## 2022-11-16 NOTE — Plan of Care (Signed)
  Problem: Education: Goal: Knowledge of General Education information will improve Description: Including pain rating scale, medication(s)/side effects and non-pharmacologic comfort measures Outcome: Progressing   Problem: Health Behavior/Discharge Planning: Goal: Ability to manage health-related needs will improve Outcome: Progressing   Problem: Clinical Measurements: Goal: Ability to maintain clinical measurements within normal limits will improve Outcome: Progressing Goal: Diagnostic test results will improve Outcome: Progressing   Problem: Nutrition: Goal: Adequate nutrition will be maintained Outcome: Progressing   Problem: Elimination: Goal: Will not experience complications related to bowel motility Outcome: Progressing Goal: Will not experience complications related to urinary retention Outcome: Progressing   Problem: Pain Managment: Goal: General experience of comfort will improve Outcome: Progressing   Problem: Skin Integrity: Goal: Risk for impaired skin integrity will decrease Outcome: Progressing

## 2022-11-16 NOTE — Progress Notes (Signed)
PROGRESS NOTE    Adrian Gonzalez  WUX:[id number] DOB: Mar 14, 1966 DOA: 11/15/2022 PCP: Practice, [facility] (Confirm with patient/family/NH records and if not entered, this HAS to be entered at [facility] point of entry. "No PCP" if truly none.)   Brief Narrative: (Start on day 1 of progress note - keep it brief and live) 57 year old male with a history of diabetes hypertension is admitted forRight second digit osteomyelitis.  7/4 podiatry consult.  Plan for surgery tomorrow  Assessment & Plan:   Principal Problem:   Cellulitis and abscess of toe of right foot Active Problems:   DM (diabetes mellitus) type 2, uncontrolled, with ketoacidosis (HCC)   Snuff user   Osteomyelitis due to type 2 diabetes mellitus (HCC)  Right second digit/foot osteomyelitis Podiatry input appreciated Plan for right second partial digit amputation tomorrow per podiatry Pain management as needed Continue IV vancomycin and Rocephin  Hyperlipidemia Continue Lipitor  Diabetes mellitus type 2 Continue insulin sliding scale  Depression Continue Wellbutrin     DVT prophylaxis: (Lovenox/Heparin/SCD's/anticoagulated/None (if comfort care) heparin injection 5,000 Units Start: 11/15/22 2200     Code Status: Full code Family Communication: NO "discussed with patient" Disposition Plan: Likely discharge tomorrow after surgery depending on clinical condition   Consultants:  Podiatry   Antimicrobials: (specify start and planned stop date. Auto populated tables are space occupying and do not give end dates) Rocephin and vancomycin   Subjective: Patient very appreciative of his care while here.  Denies any complaint.  Agreeable with surgical intervention tomorrow  Objective: Vitals:   11/15/22 2219 11/15/22 2245 11/15/22 2257 11/16/22 0739  BP: (!) 120/90 137/84 (!) 151/79 136/79  Pulse: 90 88 86 73  Resp: 16 15 19 17   Temp:  97.7 F (36.5 C) 97.7 F (36.5 C) 98.9 F (37.2 C)  TempSrc:   Oral    SpO2: 100% 100% 99% 99%  Weight:      Height:        Intake/Output Summary (Last 24 hours) at 11/16/2022 1454 Last data filed at 11/16/2022 0549 Gross per 24 hour  Intake 237.28 ml  Output --  Net 237.28 ml   Filed Weights   11/15/22 1818  Weight: 117.9 kg    Examination:  General exam: Appears calm and comfortable  Respiratory system: Clear to auscultation. Respiratory effort normal. Cardiovascular system: S1 & S2 heard, RRR. No JVD, murmurs, rubs, gallops or clicks. No pedal edema. Gastrointestinal system: Abdomen is nondistended, soft and nontender. No organomegaly or masses felt. Normal bowel sounds heard. Central nervous system: Alert and oriented. No focal neurological deficits. Extremities: Symmetric 5 x 5 power. Skin: Distal right second toe wound with fibrotic/necrotic base  Psychiatry: Judgement and insight appear normal. Mood & affect appropriate.     Data Reviewed: I have personally reviewed following labs and imaging studies  CBC: Recent Labs  Lab 11/15/22 1810  WBC 7.0  NEUTROABS 4.9  HGB 13.1  HCT 38.5*  MCV 91.0  PLT 204   Basic Metabolic Panel: Recent Labs  Lab 11/15/22 1810  NA 138  K 3.9  CL 105  CO2 22  GLUCOSE 167*  BUN 24*  CREATININE 0.92  CALCIUM 9.3   GFR: Estimated Creatinine Clearance: 124.3 mL/min (by C-G formula based on SCr of 0.92 mg/dL). Liver Function Tests: Recent Labs  Lab 11/15/22 1810  AST 23  ALT 34  ALKPHOS 53  BILITOT 1.4*  PROT 7.2  ALBUMIN 4.5   No results for input(s): "LIPASE", "AMYLASE" in the  last 168 hours. No results for input(s): "AMMONIA" in the last 168 hours. Coagulation Profile: Recent Labs  Lab 11/16/22 0355  INR 1.1   Cardiac Enzymes: No results for input(s): "CKTOTAL", "CKMB", "CKMBINDEX", "TROPONINI" in the last 168 hours. BNP (last 3 results) No results for input(s): "PROBNP" in the last 8760 hours. HbA1C: Recent Labs    11/15/22 2200  HGBA1C 6.9*   CBG: Recent Labs   Lab 11/15/22 2206 11/16/22 0738 11/16/22 1201  GLUCAP 133* 137* 149*   Lipid Profile: No results for input(s): "CHOL", "HDL", "LDLCALC", "TRIG", "CHOLHDL", "LDLDIRECT" in the last 72 hours. Thyroid Function Tests: No results for input(s): "TSH", "T4TOTAL", "FREET4", "T3FREE", "THYROIDAB" in the last 72 hours. Anemia Panel: No results for input(s): "VITAMINB12", "FOLATE", "FERRITIN", "TIBC", "IRON", "RETICCTPCT" in the last 72 hours. Sepsis Labs: Recent Labs  Lab 11/15/22 1950 11/16/22 0355  PROCALCITON  --  <0.10  LATICACIDVEN 1.7  --     Recent Results (from the past 240 hour(s))  Blood culture (routine x 2)     Status: None (Preliminary result)   Collection Time: 11/15/22  7:50 PM   Specimen: BLOOD  Result Value Ref Range Status   Specimen Description BLOOD RIGHT ANTECUBITAL  Final   Special Requests   Final    BOTTLES DRAWN AEROBIC AND ANAEROBIC Blood Culture results may not be optimal due to an excessive volume of blood received in culture bottles   Culture   Final    NO GROWTH < 12 HOURS Performed at [facility], [address] [place], [place] [address]    Report Status PENDING  Incomplete  Blood culture (routine x 2)     Status: None (Preliminary result)   Collection Time: 11/15/22  7:52 PM   Specimen: BLOOD  Result Value Ref Range Status   Specimen Description BLOOD BLOOD RIGHT FOREARM  Final   Special Requests   Final    BOTTLES DRAWN AEROBIC AND ANAEROBIC Blood Culture results may not be optimal due to an excessive volume of blood received in culture bottles   Culture   Final    NO GROWTH < 12 HOURS Performed at [facility], [address] [place], [place] [address]    Report Status PENDING  Incomplete         Radiology Studies: US ARTERIAL ABI (SCREENING LOWER EXTREMITY)  Result Date: 11/16/2022 CLINICAL DATA:  Skin ulcer affecting the right great toe. Right lower extremity rest and claudication symptoms. History of diabetes  and smoking. Evaluate for peripheral arterial disease. EXAM: NONINVASIVE PHYSIOLOGIC VASCULAR STUDY OF BILATERAL LOWER EXTREMITIES TECHNIQUE: Evaluation of both lower extremities were performed at rest, including calculation of ankle-brachial indices with single level Doppler, pressure and pulse volume recording. COMPARISON:  None Available. FINDINGS: Right ABI:  1.31 Left ABI:  1.27 Right Lower Extremity:  Normal arterial waveforms at the ankle. Left Lower Extremity:  Normal arterial waveforms at the ankle. IMPRESSION: Normal ABIs bilaterally. If clinical concern persists, further evaluation with CTA run-off could be performed as indicated. Electronically Signed   By: Simonne Come M.D.   On: 11/16/2022 12:00   MR FOOT RIGHT WO CONTRAST  Result Date: 11/16/2022 CLINICAL DATA:  Diabetic foot swelling EXAM: MRI OF THE RIGHT FOREFOOT WITHOUT CONTRAST TECHNIQUE: Multiplanar, multisequence MR imaging of the right forefoot was performed. No intravenous contrast was administered. COMPARISON:  None Available. FINDINGS: Bones/Joint/Cartilage No fracture or dislocation. Normal alignment. Marrow edema in the first distal phalanx with mild surrounding soft tissue edema concerning  for osteomyelitis versus less likely osseous contusion. Marrow edema in the second distal phalanx with a with surrounding soft tissue edema concerning for osteomyelitis. 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 Small first MTP joint effusion. An infectious etiology is considered less likely. Ligaments Collateral ligaments are intact.  Lisfranc ligament is intact. Muscles and Tendons Flexor, peroneal and extensor compartment tendons are intact. Generalized muscle atrophy. Soft tissue No fluid collection or hematoma. No soft  tissue mass. Generalized soft tissue edema along the dorsal aspect of the forefoot. IMPRESSION: 1. Marrow edema in the first distal phalanx with mild surrounding soft tissue edema concerning for osteomyelitis versus less likely osseous contusion. 2. Marrow edema in the second distal phalanx with a with surrounding soft tissue edema concerning for osteomyelitis. 3. 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 Small first MTP joint effusion. An infectious etiology is considered less likely. Electronically Signed   By: Elige Ko M.D.   On: 11/16/2022 08:24   DG Foot Complete Right  Result Date: 11/15/2022 CLINICAL DATA:  Diabetic foot.  Infection of the second digit. EXAM: RIGHT FOOT COMPLETE - 3+ VIEW COMPARISON:  Right foot radiograph dated 05/24/2004. FINDINGS: There is no acute fracture or dislocation. The bones are osteopenic. Severe degenerative changes of the first MTP joint with severe joint space narrowing and irregularity. There is sclerotic changes of the bone adjacent to the MTP joint. There is mild soft tissue swelling of the second digit. No radiopaque foreign object or soft tissue gas. IMPRESSION: 1. No acute fracture or dislocation. 2. 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 If there is clinical concern for osteomyelitis, MRI or a white blood cell nuclear scan may provide better evaluation. Electronically Signed   By: Elgie Collard M.D.   On: 11/15/2022 19:37        Scheduled Meds:  aspirin EC  81 mg Oral Daily   atorvastatin  10 mg Oral Daily   buPROPion  300 mg Oral Daily   heparin  5,000 Units Subcutaneous Q8H   insulin aspart  0-15 Units Subcutaneous TID WC   Continuous Infusions:  cefTRIAXone (ROCEPHIN)  IV  Stopped (11/16/22 0428)   lactated ringers Stopped (11/15/22 2247)   vancomycin 1,500 mg (11/16/22 0804)     LOS: 0 days    Time spent: 35 minutes    Jeraline Marcinek Sherryll Burger, MD [facility] Pager [phone]  If 7PM-7AM, please contact night-coverage [email or web address] Password [email or web address] 11/16/2022, 2:54 PM

## 2022-11-16 NOTE — Assessment & Plan Note (Signed)
History of poorly controlled diabetes. A1c to gauge therapy. Currently patient will be n.p.o. and covered with  sliding scale insulin. Carb consistent cardiac diet once patient is evaluated by podiatry.

## 2022-11-16 NOTE — Assessment & Plan Note (Signed)
Discussed with patient about tobacco cessation. Nicotine patch.

## 2022-11-16 NOTE — Progress Notes (Signed)
PHARMACY - PHYSICIAN COMMUNICATION CRITICAL VALUE ALERT - BLOOD CULTURE IDENTIFICATION (BCID)  Adrian Gonzalez is an 57 y.o. male who presented to [facility] on 11/15/2022 with a chief complaint of R foot pain/abbesses/osteomyelitis.   Assessment:  1/4 ((anaerobic) GPC: not detected on BCID suspected R foot source or contaminant  Name of physician (or Provider) Contacted:   Current antibiotics: Vancomycin and Ceftriaxone  Changes to prescribed antibiotics recommended:  Patient is on recommended antibiotics - No changes needed  Results for orders placed or performed during the hospital encounter of 11/15/22  Blood Culture ID Panel (Reflexed) (Collected: 11/15/2022  7:52 PM)  Result Value Ref Range   Enterococcus faecalis NOT DETECTED NOT DETECTED   Enterococcus Faecium NOT DETECTED NOT DETECTED   Listeria monocytogenes NOT DETECTED NOT DETECTED   Staphylococcus species NOT DETECTED NOT DETECTED   Staphylococcus aureus (BCID) NOT DETECTED NOT DETECTED   Staphylococcus epidermidis NOT DETECTED NOT DETECTED   Staphylococcus lugdunensis NOT DETECTED NOT DETECTED   Streptococcus species NOT DETECTED NOT DETECTED   Streptococcus agalactiae NOT DETECTED NOT DETECTED   Streptococcus pneumoniae NOT DETECTED NOT DETECTED   Streptococcus pyogenes NOT DETECTED NOT DETECTED   A.calcoaceticus-baumannii NOT DETECTED NOT DETECTED   Bacteroides fragilis NOT DETECTED NOT DETECTED   Enterobacterales NOT DETECTED NOT DETECTED   Enterobacter cloacae complex NOT DETECTED NOT DETECTED   Escherichia coli NOT DETECTED NOT DETECTED   Klebsiella aerogenes NOT DETECTED NOT DETECTED   Klebsiella oxytoca NOT DETECTED NOT DETECTED   Klebsiella pneumoniae NOT DETECTED NOT DETECTED   Proteus species NOT DETECTED NOT DETECTED   Salmonella species NOT DETECTED NOT DETECTED   Serratia marcescens NOT DETECTED NOT DETECTED   Haemophilus influenzae NOT DETECTED NOT DETECTED   Neisseria meningitidis NOT DETECTED NOT  DETECTED   Pseudomonas aeruginosa NOT DETECTED NOT DETECTED   Stenotrophomonas maltophilia NOT DETECTED NOT DETECTED   Candida albicans NOT DETECTED NOT DETECTED   Candida auris NOT DETECTED NOT DETECTED   Candida glabrata NOT DETECTED NOT DETECTED   Candida krusei NOT DETECTED NOT DETECTED   Candida parapsilosis NOT DETECTED NOT DETECTED   Candida tropicalis NOT DETECTED NOT DETECTED   Cryptococcus neoformans/gattii NOT DETECTED NOT DETECTED    Sharen Hones, PharmD, BCPS Clinical Pharmacist   11/16/2022  4:29 PM

## 2022-11-17 ENCOUNTER — Other Ambulatory Visit: Payer: Self-pay

## 2022-11-17 ENCOUNTER — Encounter
Admission: EM | Disposition: A | Discharge status: Home / Self Care | Payer: Self-pay | Source: Home / Self Care | Attending: Internal Medicine

## 2022-11-17 ENCOUNTER — Inpatient Hospital Stay: Payer: BC Managed Care – PPO | Admitting: Certified Registered Nurse Anesthetist

## 2022-11-17 ENCOUNTER — Inpatient Hospital Stay: Payer: BC Managed Care – PPO

## 2022-11-17 ENCOUNTER — Encounter: Payer: Self-pay | Admitting: Internal Medicine

## 2022-11-17 DIAGNOSIS — L089 Local infection of the skin and subcutaneous tissue, unspecified: Secondary | ICD-10-CM

## 2022-11-17 DIAGNOSIS — L03031 Cellulitis of right toe: Secondary | ICD-10-CM | POA: Diagnosis not present

## 2022-11-17 DIAGNOSIS — E11628 Type 2 diabetes mellitus with other skin complications: Secondary | ICD-10-CM | POA: Diagnosis not present

## 2022-11-17 DIAGNOSIS — M869 Osteomyelitis, unspecified: Secondary | ICD-10-CM | POA: Diagnosis not present

## 2022-11-17 DIAGNOSIS — E1110 Type 2 diabetes mellitus with ketoacidosis without coma: Secondary | ICD-10-CM | POA: Diagnosis not present

## 2022-11-17 DIAGNOSIS — E1169 Type 2 diabetes mellitus with other specified complication: Secondary | ICD-10-CM | POA: Diagnosis not present

## 2022-11-17 DIAGNOSIS — L02611 Cutaneous abscess of right foot: Secondary | ICD-10-CM | POA: Diagnosis not present

## 2022-11-17 HISTORY — PX: AMPUTATION TOE: SHX6595

## 2022-11-17 LAB — BASIC METABOLIC PANEL
Anion gap: 6 (ref 5–15)
BUN: 21 mg/dL — ABNORMAL HIGH (ref 6–20)
CO2: 21 mmol/L — ABNORMAL LOW (ref 22–32)
Calcium: 8.6 mg/dL — ABNORMAL LOW (ref 8.9–10.3)
Chloride: 109 mmol/L (ref 98–111)
Creatinine, Ser: 0.77 mg/dL (ref 0.61–1.24)
GFR, Estimated: >60 mL/min (ref >60)
Glucose, Bld: 145 mg/dL — ABNORMAL HIGH (ref 70–99)
Potassium: 4 mmol/L (ref 3.5–5.1)
Sodium: 136 mmol/L (ref 135–145)

## 2022-11-17 LAB — CBC
HCT: 38 % — ABNORMAL LOW (ref 39.0–52.0)
Hemoglobin: 12.7 g/dL — ABNORMAL LOW (ref 13.0–17.0)
MCH: 30.5 pg (ref 26.0–34.0)
MCHC: 33.4 g/dL (ref 30.0–36.0)
MCV: 91.3 fL (ref 80.0–100.0)
Platelets: 190 10*3/uL (ref 150–400)
RBC: 4.16 MIL/uL — ABNORMAL LOW (ref 4.22–5.81)
RDW: 12.3 % (ref 11.5–15.5)
WBC: 4.6 10*3/uL (ref 4.0–10.5)
nRBC: 0 % (ref 0.0–0.2)

## 2022-11-17 LAB — GLUCOSE, CAPILLARY
Glucose-Capillary: 140 mg/dL — ABNORMAL HIGH (ref 70–99)
Glucose-Capillary: 139 mg/dL — ABNORMAL HIGH (ref 70–99)
Glucose-Capillary: 151 mg/dL — ABNORMAL HIGH (ref 70–99)
Glucose-Capillary: 148 mg/dL — ABNORMAL HIGH (ref 70–99)

## 2022-11-17 LAB — SURGICAL PCR SCREEN
MRSA, PCR: NEGATIVE
Staphylococcus aureus: POSITIVE — AB

## 2022-11-17 SURGERY — AMPUTATION, TOE
Anesthesia: General | Site: Toe | Laterality: Right

## 2022-11-17 MED ORDER — ACETAMINOPHEN 10 MG/ML IV SOLN
INTRAVENOUS | Status: AC
  Filled 2022-11-17: qty 100

## 2022-11-17 MED ORDER — PROPOFOL 500 MG/50ML IV EMUL
INTRAVENOUS | Status: DC | PRN
  Administered 2022-11-17: 100 ug/kg/min via INTRAVENOUS

## 2022-11-17 MED ORDER — MIDAZOLAM HCL 2 MG/2ML IJ SOLN
INTRAMUSCULAR | Status: AC
  Filled 2022-11-17: qty 2

## 2022-11-17 MED ORDER — MUPIROCIN 2 % EX OINT
1.0000 | TOPICAL_OINTMENT | Freq: Two times a day (BID) | CUTANEOUS | Status: DC
  Administered 2022-11-17: 1 via NASAL
  Filled 2022-11-17: qty 22

## 2022-11-17 MED ORDER — OXYCODONE HCL 5 MG PO TABS: 5.0000 mg | ORAL_TABLET | Freq: Once | ORAL | Status: DC | PRN

## 2022-11-17 MED ORDER — ACETAMINOPHEN 10 MG/ML IV SOLN: 1000.0000 mg | Freq: Once | INTRAVENOUS | Status: DC | PRN

## 2022-11-17 MED ORDER — HYDROCODONE-ACETAMINOPHEN 5-325 MG PO TABS: 1.0000 | ORAL_TABLET | Freq: Three times a day (TID) | ORAL | 0 refills | Status: AC | PRN

## 2022-11-17 MED ORDER — FENTANYL CITRATE (PF) 100 MCG/2ML IJ SOLN
INTRAMUSCULAR | Status: DC | PRN
  Administered 2022-11-17: 50 ug via INTRAVENOUS

## 2022-11-17 MED ORDER — ACETAMINOPHEN 10 MG/ML IV SOLN
INTRAVENOUS | Status: DC | PRN
  Administered 2022-11-17: 1000 mg via INTRAVENOUS

## 2022-11-17 MED ORDER — LIDOCAINE HCL 1 % IJ SOLN
INTRAMUSCULAR | Status: DC | PRN
  Administered 2022-11-17: 8 mL via INTRAMUSCULAR

## 2022-11-17 MED ORDER — OXYCODONE HCL 5 MG/5ML PO SOLN: 5.0000 mg | Freq: Once | ORAL | Status: DC | PRN

## 2022-11-17 MED ORDER — MIDAZOLAM HCL 2 MG/2ML IJ SOLN
INTRAMUSCULAR | Status: DC | PRN
  Administered 2022-11-17: 2 mg via INTRAVENOUS

## 2022-11-17 MED ORDER — FENTANYL CITRATE (PF) 100 MCG/2ML IJ SOLN: 25.0000 ug | INTRAMUSCULAR | Status: DC | PRN

## 2022-11-17 MED ORDER — SODIUM CHLORIDE 0.9 % IV SOLN: INTRAVENOUS | Status: DC | PRN

## 2022-11-17 MED ORDER — IBUPROFEN 600 MG PO TABS: 600.0000 mg | ORAL_TABLET | Freq: Three times a day (TID) | ORAL | 0 refills | Status: AC | PRN

## 2022-11-17 MED ORDER — ONDANSETRON HCL 4 MG/2ML IJ SOLN: 4.0000 mg | Freq: Once | INTRAMUSCULAR | Status: DC | PRN

## 2022-11-17 MED ORDER — AMOXICILLIN-POT CLAVULANATE 500-125 MG PO TABS: 1.0000 | ORAL_TABLET | Freq: Three times a day (TID) | ORAL | 0 refills | Status: AC

## 2022-11-17 MED ORDER — PROPOFOL 10 MG/ML IV BOLUS
INTRAVENOUS | Status: AC
  Filled 2022-11-17: qty 20

## 2022-11-17 MED ORDER — PROPOFOL 10 MG/ML IV BOLUS
INTRAVENOUS | Status: DC | PRN
  Administered 2022-11-17: 10 mg via INTRAVENOUS
  Administered 2022-11-17: 30 mg via INTRAVENOUS

## 2022-11-17 MED ORDER — FENTANYL CITRATE (PF) 100 MCG/2ML IJ SOLN
INTRAMUSCULAR | Status: AC
  Filled 2022-11-17: qty 2

## 2022-11-17 MED ORDER — BUPIVACAINE HCL (PF) 0.5 % IJ SOLN
INTRAMUSCULAR | Status: AC
  Filled 2022-11-17: qty 30

## 2022-11-17 MED ORDER — LIDOCAINE HCL (PF) 1 % IJ SOLN
INTRAMUSCULAR | Status: AC
  Filled 2022-11-17: qty 30

## 2022-11-17 MED ORDER — 0.9 % SODIUM CHLORIDE (POUR BTL) OPTIME
TOPICAL | Status: DC | PRN
  Administered 2022-11-17: 500 mL

## 2022-11-17 SURGICAL SUPPLY — 48 items
BLADE MED AGGRESSIVE (BLADE) ×1 IMPLANT
BLADE OSC/SAGITTAL MD 5.5X18 (BLADE) IMPLANT
BLADE SURG 15 STRL LF DISP TIS (BLADE) IMPLANT
BLADE SURG 15 STRL SS (BLADE)
BLADE SURG MINI STRL (BLADE) IMPLANT
BNDG CMPR 5X4 KNIT ELC UNQ LF (GAUZE/BANDAGES/DRESSINGS) ×1
BNDG CMPR 75X21 PLY HI ABS (MISCELLANEOUS)
BNDG ELASTIC 4INX 5YD STR LF (GAUZE/BANDAGES/DRESSINGS) ×1 IMPLANT
BNDG ESMARCH 4 X 12 STRL LF (GAUZE/BANDAGES/DRESSINGS)
BNDG ESMARCH 4X12 STRL LF (GAUZE/BANDAGES/DRESSINGS) ×1 IMPLANT
BNDG GAUZE DERMACEA FLUFF 4 (GAUZE/BANDAGES/DRESSINGS) ×1 IMPLANT
BNDG GZE DERMACEA 4 6PLY (GAUZE/BANDAGES/DRESSINGS)
CNTNR URN SCR LID CUP LEK RST (MISCELLANEOUS) ×1 IMPLANT
CONT SPEC 4OZ STRL OR WHT (MISCELLANEOUS) ×1
CUFF TOURN SGL QUICK 12 (TOURNIQUET CUFF) IMPLANT
CUFF TOURN SGL QUICK 18X4 (TOURNIQUET CUFF) IMPLANT
DRAPE FLUOR MINI C-ARM 54X84 (DRAPES) IMPLANT
DURAPREP 26ML APPLICATOR (WOUND CARE) ×1 IMPLANT
ELECT REM PT RETURN 9FT ADLT (ELECTROSURGICAL) ×1
ELECTRODE REM PT RTRN 9FT ADLT (ELECTROSURGICAL) ×1 IMPLANT
GAUZE SPONGE 4X4 12PLY STRL (GAUZE/BANDAGES/DRESSINGS) ×2 IMPLANT
GAUZE STRETCH 2X75IN STRL (MISCELLANEOUS) IMPLANT
GAUZE XEROFORM 1X8 LF (GAUZE/BANDAGES/DRESSINGS) ×1 IMPLANT
GLOVE BIO SURGEON STRL SZ7.5 (GLOVE) ×2 IMPLANT
GOWN STRL REUS W/ TWL LRG LVL3 (GOWN DISPOSABLE) ×2 IMPLANT
GOWN STRL REUS W/TWL LRG LVL3 (GOWN DISPOSABLE) ×2
HANDPIECE VERSAJET DEBRIDEMENT (MISCELLANEOUS) IMPLANT
IV NS IRRIG 3000ML ARTHROMATIC (IV SOLUTION) IMPLANT
KIT TURNOVER KIT A (KITS) ×1 IMPLANT
LABEL OR SOLS (LABEL) ×1 IMPLANT
MANIFOLD NEPTUNE II (INSTRUMENTS) ×1 IMPLANT
NDL FILTER BLUNT 18X1 1/2 (NEEDLE) ×1 IMPLANT
NDL HYPO 25X1 1.5 SAFETY (NEEDLE) ×2 IMPLANT
NEEDLE FILTER BLUNT 18X1 1/2 (NEEDLE) ×1 IMPLANT
NEEDLE HYPO 25X1 1.5 SAFETY (NEEDLE) ×2 IMPLANT
NS IRRIG 500ML POUR BTL (IV SOLUTION) ×1 IMPLANT
PACK EXTREMITY ARMC (MISCELLANEOUS) ×1 IMPLANT
SOL PREP PVP 2OZ (MISCELLANEOUS)
SOLUTION PREP PVP 2OZ (MISCELLANEOUS) ×1 IMPLANT
STOCKINETTE STRL 6IN 960660 (GAUZE/BANDAGES/DRESSINGS) ×1 IMPLANT
SUT ETHILON 3-0 FS-10 30 BLK (SUTURE) ×1
SUT VIC AB 3-0 SH 27 (SUTURE) ×1
SUT VIC AB 3-0 SH 27X BRD (SUTURE) ×1 IMPLANT
SUTURE EHLN 3-0 FS-10 30 BLK (SUTURE) ×2 IMPLANT
SWAB CULTURE AMIES ANAERIB BLU (MISCELLANEOUS) IMPLANT
SYR 10ML LL (SYRINGE) ×1 IMPLANT
TRAP FLUID SMOKE EVACUATOR (MISCELLANEOUS) ×1 IMPLANT
WATER STERILE IRR 500ML POUR (IV SOLUTION) ×1 IMPLANT

## 2022-11-17 NOTE — Interval H&P Note (Signed)
History and Physical Interval Note:  11/17/2022 9:59 AM  Adrian Gonzalez  has presented today for surgery, with the diagnosis of Right second digit osteomyelitis.  The various methods of treatment have been discussed with the patient and family. After consideration of risks, benefits and other options for treatment, the patient has consented to  Procedure(s) with comments: AMPUTATION TOE (Right) - surgical team to do local block as a surgical intervention.  The patient's history has been reviewed, patient examined, no change in status, stable for surgery.  I have reviewed the patient's chart and labs.  Questions were answered to the patient's satisfaction.     Louann Sjogren

## 2022-11-17 NOTE — Transfer of Care (Signed)
Immediate Anesthesia Transfer of Care Note  Patient: Adrian Gonzalez  Procedure(s) Performed: AMPUTATION RIGHT SECOND TOE (Right: Toe)  Patient Location: PACU  Anesthesia Type:General  Level of Consciousness: awake, alert , and oriented  Airway & Oxygen Therapy: Patient Spontanous Breathing  Post-op Assessment: Report given to RN and Post -op Vital signs reviewed and stable  Post vital signs: Reviewed and stable  Last Vitals:  Vitals Value Taken Time  BP 112/66 11/17/22 1101  Temp 36.7 C 11/17/22 1100  Pulse 76 11/17/22 1104  Resp 14 11/17/22 1104  SpO2 98 % 11/17/22 1104  Vitals shown include unvalidated device data.  Last Pain:  Vitals:   11/17/22 0954  TempSrc: Temporal  PainSc: 5       Patients Stated Pain Goal: 0 (11/17/22 0954)  Complications: No notable events documented.

## 2022-11-17 NOTE — Anesthesia Preprocedure Evaluation (Signed)
Anesthesia Evaluation  Patient identified by MRN, date of birth, ID band Patient awake    Reviewed: Allergy & Precautions, NPO status , Patient's Chart, lab work & pertinent test results  History of Anesthesia Complications Negative for: history of anesthetic complications  Airway Mallampati: II  TM Distance: >3 FB Neck ROM: Full    Dental no notable dental hx. (+) Teeth Intact   Pulmonary neg pulmonary ROS, neg sleep apnea, neg COPD, Patient abstained from smoking.Not current smoker   Pulmonary exam normal breath sounds clear to auscultation       Cardiovascular Exercise Tolerance: Good METShypertension, Pt. on medications (-) CAD and (-) Past MI (-) dysrhythmias  Rhythm:Regular Rate:Normal - Systolic murmurs    Neuro/Psych  PSYCHIATRIC DISORDERS  Depression    negative neurological ROS     GI/Hepatic ,neg GERD  ,,(+)     (-) substance abuse    Endo/Other  diabetes, Well Controlled, Oral Hypoglycemic Agents    Renal/GU negative Renal ROS     Musculoskeletal   Abdominal   Peds  Hematology   Anesthesia Other Findings Past Medical History: No date: Diabetes mellitus without complication (HCC) No date: Hypertension  Reproductive/Obstetrics                             Anesthesia Physical Anesthesia Plan  ASA: 2  Anesthesia Plan: General   Post-op Pain Management: Minimal or no pain anticipated   Induction: Intravenous  PONV Risk Score and Plan: 2 and Propofol infusion, TIVA, Ondansetron and Midazolam  Airway Management Planned: Nasal Cannula  Additional Equipment: None  Intra-op Plan:   Post-operative Plan:   Informed Consent: I have reviewed the patients History and Physical, chart, labs and discussed the procedure including the risks, benefits and alternatives for the proposed anesthesia with the patient or authorized representative who has indicated his/her understanding  and acceptance.     Dental advisory given  Plan Discussed with: CRNA and Surgeon  Anesthesia Plan Comments: (Discussed risks of anesthesia with patient, including possibility of difficulty with spontaneous ventilation under anesthesia necessitating airway intervention, PONV, and rare risks such as cardiac or respiratory or neurological events, and allergic reactions. Discussed the role of CRNA in patient's perioperative care. Patient understands.)       Anesthesia Quick Evaluation

## 2022-11-17 NOTE — Plan of Care (Signed)
  Problem: Education: Goal: Knowledge of General Education information will improve Description: Including pain rating scale, medication(s)/side effects and non-pharmacologic comfort measures Outcome: Progressing   Problem: Health Behavior/Discharge Planning: Goal: Ability to manage health-related needs will improve Outcome: Progressing   Problem: Clinical Measurements: Goal: Ability to maintain clinical measurements within normal limits will improve Outcome: Progressing Goal: Diagnostic test results will improve Outcome: Progressing   Problem: Nutrition: Goal: Adequate nutrition will be maintained Outcome: Progressing   Problem: Elimination: Goal: Will not experience complications related to bowel motility Outcome: Progressing Goal: Will not experience complications related to urinary retention Outcome: Progressing   Problem: Pain Managment: Goal: General experience of comfort will improve Outcome: Progressing   

## 2022-11-17 NOTE — Op Note (Signed)
   OPERATIVE REPORT Patient name: Adrian Gonzalez MRN: [id number] DOB: [birth year]  DOS:  11/17/22  Preop Dx: Diabetic infection of right foot (HCC) Postop Dx: same  Procedure:  1. Right partial second digit amputation   Surgeon: Louann Sjogren, DPM  Anesthesia: 50-50 mixture of 2% lidocaine plain with 0.5% Marcaine plain totaling 10 infiltrated in the patient's right lower extremity  Hemostasis: No TQ necessary   EBL: <10 mL Materials: n/a Injectables: as above Pathology: right distal second digit for pathology and residual bone second digit for culture   Condition: The patient tolerated the procedure and anesthesia well. No complications noted or reported   Justification for procedure: The patient is a 57 y.o. male who presents today for surgical treatment of right second digit osteomyelitis . All conservative modalities of been unsuccessful in providing any sort of satisfactory alleviation of symptoms with the patient. The patient was told benefits as well as possible side effects of the surgery. The patient consented for surgical correction. The patient consent form was reviewed. All patient questions were answered. No guarantees were expressed or implied. The patient and the surgeon both signed the patient consent form with the witness present and placed in the patient's chart.   Procedure in Detail: The patient was brought to the operating room, placed in the operating table in the supine position at which time an aseptic scrub and drape were performed about the patient's respective lower extremity after anesthesia was induced as described above. Attention was then directed to the surgical area where procedure number one commenced.  Procedure #1:  Attention was directed to the right second digit were an incision was preformed encircling the base of the toe. Incision was made down to bone and digit was amputated at the level of the distal interphalangeal joint After the toe was  disarticulated it was passed off to the back table to be sent to pathology for further evaluation. The extensor and flexor tendons were identified and resected as far proximally as possible. Any necrotic tissue was removed to healthy bleeding tissue. The middle phalanx head was identified and noted to be hard. The head of the middle phalanx was resected with bone cutter to aid with closure.  The area was irrigated copiously sterile saline and residual bone was sent to microbiology. Any bleeders noted were cauterized as necessary. The skin was re-approximated utilizing 3-0 nylon suture.    Dry sterile compressive dressings were then applied to all previously mentioned incision sites about the patient's lower extremity. The patient was then transferred from the operating room to the recovery room having tolerated the procedure and anesthesia well. All vital signs are stable. After a brief stay in the recovery room the patient was readmitted to the floor.    Disposition:  Patient will be ok for discharge for podiatry standpoint. Likely will need two weeks oral antibiotics upon discharge. Will be heel weight bearing in post-op shoe. Will follow up in one week in clinic upon discharge.    Louann Sjogren, DPM [facility]  Dr. Louann Sjogren, DPM    [address] [place], [place] [address]                Office [phone]  Fax [phone]

## 2022-11-17 NOTE — Brief Op Note (Signed)
11/15/2022 - 11/17/2022  10:00 AM  PATIENT:  Adrian Gonzalez  57 y.o. male  PRE-OPERATIVE DIAGNOSIS:  Right second digit osteomyelitis  POST-OPERATIVE DIAGNOSIS:  * No post-op diagnosis entered *  PROCEDURE:  Procedure(s) with comments: AMPUTATION TOE (Right) - surgical team to do local block  SURGEON:  Surgeon(s) and Role:    * Louann Sjogren, DPM - Primary  PHYSICIAN ASSISTANT:   ASSISTANTS: none   ANESTHESIA:   local and MAC  EBL:  Minimal   BLOOD ADMINISTERED:none  DRAINS: none   LOCAL MEDICATIONS USED:  MARCAINE   , LIDOCAINE , and Amount: 8ml  SPECIMEN:  Source of Specimen:  distal right second digit for pathology. Residual phalanx specimen for culture  DISPOSITION OF SPECIMEN:   Pathology and culture   COUNTS:  YES  TOURNIQUET:  * Missing tourniquet times found for documented tourniquets in log: [id number] *  DICTATION: .Note written in EPIC  PLAN OF CARE: Admit to inpatient   PATIENT DISPOSITION:  PACU - hemodynamically stable.   Delay start of Pharmacological VTE agent (>24hrs) due to surgical blood loss or risk of bleeding: no

## 2022-11-17 NOTE — Anesthesia Procedure Notes (Signed)
Procedure Name: General with mask airway Date/Time: 11/17/2022 10:25 AM  Performed by: Ginger Carne, CRNAPre-anesthesia Checklist: Patient identified, Emergency Drugs available, Suction available, Patient being monitored and Timeout performed Oxygen Delivery Method: Simple face mask Preoxygenation: Pre-oxygenation with 100% oxygen Induction Type: IV induction

## 2022-11-17 NOTE — Progress Notes (Signed)
Reviewed discharge instructions with pt. Pt verbalized understanding. Post op shoe on. Pt discharge with all personal belongings. Staff wheeled pt out. Pt transported to home via family car.

## 2022-11-17 NOTE — Progress Notes (Signed)
Pharmacy Antibiotic Note  Adrian Gonzalez is a 57 y.o. male admitted on 11/15/2022 with cellulitis of right toe. Patient's MRI showing concern for right foot second digit osteomyelitis. Podiatry was consulted, and patient was agreeable to right second partial digit amputation, planned for 7/5. Pharmacy has been consulted for Vancomycin dosing.  Day 3 of antibiotics, also receiving Ceftriaxone 2g Q24h. Per podiatry, would like to continue to treat empirically post-amputation and will take cultures to help narrow therapy.   Plan: Will continue Vancomycin 1500 mg IV Q 12 hrs. Goal AUC 400-550. Expected AUC: 541.7 Expected Cmin: 14.0 SCr used: 0.8(actual 0.77), Vd used: 0.5  - Deferring levels currently until blood culture result and/or active MRSA infection noted(renal function also stable). Will likely de-escalate to oral options over the weekend    Height: 6\' 4"  (193 cm) Weight: 117 kg (257 lb 15 oz) IBW/kg (Calculated) : 86.8  Temp (24hrs), Avg:97.9 F (36.6 C), Min:97.1 F (36.2 C), Max:98.4 F (36.9 C)  Recent Labs  Lab 11/15/22 1810 11/15/22 1950 11/17/22 0630  WBC 7.0  --  4.6  CREATININE 0.92  --  0.77  LATICACIDVEN  --  1.7  --      Estimated Creatinine Clearance: 142.5 mL/min (by C-G formula based on SCr of 0.77 mg/dL).    No Known Allergies  Antimicrobials this admission: Cefepime 7/3 x1  Vancomycin 7/3  >>  Ceftriaxone 7/3 >>  Dose adjustments this admission: N/A  Microbiology results: 7/3 BCx: GPC  Thank you for allowing pharmacy to be a part of this patient's care.  Edson Deridder A Bren Borys 11/17/2022 10:12 AM

## 2022-11-17 NOTE — Anesthesia Postprocedure Evaluation (Signed)
Anesthesia Post Note  Patient: Adrian Gonzalez  Procedure(s) Performed: AMPUTATION RIGHT SECOND TOE (Right: Toe)  Patient location during evaluation: PACU Anesthesia Type: General Level of consciousness: awake and alert Pain management: pain level controlled Vital Signs Assessment: post-procedure vital signs reviewed and stable Respiratory status: spontaneous breathing, nonlabored ventilation, respiratory function stable and patient connected to nasal cannula oxygen Cardiovascular status: blood pressure returned to baseline and stable Postop Assessment: no apparent nausea or vomiting Anesthetic complications: no   No notable events documented.   Last Vitals:  Vitals:   11/17/22 1130 11/17/22 1203  BP: 117/64 (!) 129/93  Pulse: 71 70  Resp: 20   Temp:    SpO2: 96% 100%    Last Pain:  Vitals:   11/17/22 1130  TempSrc:   PainSc: 0-No pain                 Corinda Gubler

## 2022-11-18 ENCOUNTER — Encounter: Payer: Self-pay | Admitting: Podiatry

## 2022-11-18 LAB — CULTURE, BLOOD (ROUTINE X 2): Culture: NO GROWTH

## 2022-11-18 LAB — AEROBIC/ANAEROBIC CULTURE W GRAM STAIN (SURGICAL/DEEP WOUND)

## 2022-11-18 NOTE — Discharge Summary (Signed)
Physician Discharge Summary   Patient: Adrian Gonzalez MRN: [id number] DOB: [birth year]  Admit date:     11/15/2022  Discharge date: 11/17/2022  Discharge Physician: Delfino Lovett   PCP: [facility]   Recommendations at discharge:   Follow-up with outpatient providers as requested heel weight bearing in post-op shoe   Discharge Diagnoses: Principal Problem:   Cellulitis and abscess of toe of right foot Active Problems:   DM (diabetes mellitus) type 2, uncontrolled, with ketoacidosis (HCC)   Snuff user   Osteomyelitis due to type 2 diabetes mellitus [facility])  Hospital Course: Assessment and Plan: 57 year old male with a history of diabetes hypertension is admitted for Right second digit osteomyelitis possibly due to foot being in the [place] in the shoe for over 3 to 4 hours while at work.   Right second digit/foot osteomyelitis S/p right partial second digit amputation on 7/5.  Treated with IV antibiotics and being discharged on oral antibiotics Pain under well control Outpatient podiatry follow-up   Hyperlipidemia Diabetes mellitus type 2 Depression          Consultants: Podiatry Procedures performed: right partial second digit amputation on 7/5  Disposition: Home Diet recommendation:  Discharge Diet Orders (From admission, onward)     Start     Ordered   11/17/22 0000  Diet - low sodium heart healthy        11/17/22 1433           Carb modified diet DISCHARGE MEDICATION: Allergies as of 11/17/2022   No Known Allergies      Medication List     STOP taking these medications    DULoxetine 60 MG capsule Commonly known as: CYMBALTA   gabapentin 300 MG capsule Commonly known as: NEURONTIN   hydrOXYzine 25 MG tablet Commonly known as: ATARAX   insulin glargine 100 UNIT/ML injection Commonly known as: LANTUS   losartan 25 MG tablet Commonly known as: COZAAR   meloxicam 7.5 MG tablet Commonly known as: MOBIC   mirtazapine 7.5 MG  tablet Commonly known as: REMERON   nicotine polacrilex 2 MG gum Commonly known as: NICORETTE   terbinafine 250 MG tablet Commonly known as: LAMISIL       TAKE these medications    amoxicillin-clavulanate 500-125 MG tablet Commonly known as: Augmentin Take 1 tablet by mouth 3 (three) times daily for 10 days.   atorvastatin 10 MG tablet Commonly known as: LIPITOR Take 10 mg by mouth daily.   buPROPion 300 MG 24 hr tablet Commonly known as: WELLBUTRIN XL Take 300 mg by mouth daily.   cyclobenzaprine 10 MG tablet Commonly known as: FLEXERIL Take 10 mg by mouth 3 (three) times daily.   HYDROcodone-acetaminophen 5-325 MG tablet Commonly known as: NORCO/VICODIN Take 1 tablet by mouth 3 (three) times daily with meals as needed for up to 3 days for moderate pain or severe pain (any level pain.).   ibuprofen 600 MG tablet Commonly known as: ADVIL Take 1 tablet (600 mg total) by mouth every 8 (eight) hours as needed for moderate pain, mild pain, headache or fever.   metFORMIN 1000 MG tablet Commonly known as: GLUCOPHAGE Take 1,000-1,500 mg by mouth 2 (two) times daily with a meal.        Follow-up Information     [facility]. Schedule an appointment as soon as possible for a visit in 3 day(s).   Specialty: Family Medicine Why: [facility] Discharge F/UP Contact information: [address] [place]  [address] [phone]         Louann Sjogren, DPM. Schedule an appointment as soon as possible for a visit in 1 week(s).   Specialty: Podiatry Why: [facility] Discharge F/UP Contact information: [address] [place] [address] [phone]                Discharge Exam: Ceasar Mons Weights   11/15/22 1818 11/17/22 0954  Weight: 117.9 kg 117 kg   General exam: Appears calm and comfortable  Respiratory system: Clear to auscultation. Respiratory effort normal. Cardiovascular system: S1 & S2 heard, RRR. No JVD, murmurs,  rubs, gallops or clicks. No pedal edema. Gastrointestinal system: Abdomen is nondistended, soft and nontender. No organomegaly or masses felt. Normal bowel sounds heard. Central nervous system: Alert and oriented. No focal neurological deficits. Extremities: Symmetric 5 x 5 power.  Right foot in the dry dressing.   Condition at discharge: good  The results of significant diagnostics from this hospitalization (including imaging, microbiology, ancillary and laboratory) are listed below for reference.   Imaging Studies: DG Foot Complete Right  Result Date: 11/17/2022 CLINICAL DATA:  Osteomyelitis of the right foot EXAM: RIGHT FOOT COMPLETE - 3 VIEW COMPARISON:  Right foot radiographs dated 11/15/2022, MR foot dated 11/16/2022 FINDINGS: Postsurgical changes from second toe middle phalangeal amputation. Again seen are severe degenerative changes of the great toe metatarsophalangeal joint width diffusely increased sclerosis and subchondral lucencies. Marrow edema of the first toe distal phalanx is better evaluated on prior MRI. IMPRESSION: 1. Postsurgical changes from amputation of second toe middle phalanx. 2. Marrow edema of the first toe distal phalanx is better evaluated on prior MRI. 3. Severe degenerative changes of the great toe metatarsophalangeal joint. Electronically Signed   By: Agustin Cree M.D.   On: 11/17/2022 15:44   US ARTERIAL ABI (SCREENING LOWER EXTREMITY)  Result Date: 11/16/2022 CLINICAL DATA:  Skin ulcer affecting the right great toe. Right lower extremity rest and claudication symptoms. History of diabetes and smoking. Evaluate for peripheral arterial disease. EXAM: NONINVASIVE PHYSIOLOGIC VASCULAR STUDY OF BILATERAL LOWER EXTREMITIES TECHNIQUE: Evaluation of both lower extremities were performed at rest, including calculation of ankle-brachial indices with single level Doppler, pressure and pulse volume recording. COMPARISON:  None Available. FINDINGS: Right ABI:  1.31 Left ABI:  1.27  Right Lower Extremity:  Normal arterial waveforms at the ankle. Left Lower Extremity:  Normal arterial waveforms at the ankle. IMPRESSION: Normal ABIs bilaterally. If clinical concern persists, further evaluation with CTA run-off could be performed as indicated. Electronically Signed   By: Simonne Come M.D.   On: 11/16/2022 12:00   MR FOOT RIGHT WO CONTRAST  Result Date: 11/16/2022 CLINICAL DATA:  Diabetic foot swelling EXAM: MRI OF THE RIGHT FOREFOOT WITHOUT CONTRAST TECHNIQUE: Multiplanar, multisequence MR imaging of the right forefoot was performed. No intravenous contrast was administered. COMPARISON:  None Available. FINDINGS: Bones/Joint/Cartilage No fracture or dislocation. Normal alignment. Marrow edema in the first distal phalanx with mild surrounding soft tissue edema concerning for osteomyelitis versus less likely osseous contusion. Marrow edema in the second distal phalanx with a with surrounding soft tissue edema concerning for osteomyelitis. 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 Small first MTP joint effusion. An infectious etiology is considered less likely. Ligaments Collateral ligaments are intact.  Lisfranc ligament is intact. Muscles and  Tendons Flexor, peroneal and extensor compartment tendons are intact. Generalized muscle atrophy. Soft tissue No fluid collection or hematoma. No soft tissue mass. Generalized soft tissue edema along the dorsal aspect of the forefoot. IMPRESSION: 1. Marrow edema in the first distal phalanx with mild surrounding soft tissue edema concerning for osteomyelitis versus less likely osseous contusion. 2. Marrow edema in the second distal phalanx with a with surrounding soft tissue edema concerning for osteomyelitis. 3. 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 Small first MTP joint effusion. An infectious etiology is considered less likely. Electronically Signed   By: Elige Ko M.D.   On: 11/16/2022 08:24   DG Foot Complete Right  Result Date: 11/15/2022 CLINICAL DATA:  Diabetic foot.  Infection of the second digit. EXAM: RIGHT FOOT COMPLETE - 3+ VIEW COMPARISON:  Right foot radiograph dated 05/24/2004. FINDINGS: There is no acute fracture or dislocation. The bones are osteopenic. Severe degenerative changes of the first MTP joint with severe joint space narrowing and irregularity. There is sclerotic changes of the bone adjacent to the MTP joint. There is mild soft tissue swelling of the second digit. No radiopaque foreign object or soft tissue gas. IMPRESSION: 1. No acute fracture or dislocation. 2. 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 If there is clinical concern for osteomyelitis, MRI or a white blood cell nuclear scan may provide better evaluation. Electronically Signed   By: Elgie Collard M.D.   On: 11/15/2022 19:37    Microbiology: Results for orders placed or performed during the hospital encounter of 11/15/22  Blood culture (routine x 2)     Status: None (Preliminary result)   Collection Time: 11/15/22  7:50 PM   Specimen: BLOOD  Result Value Ref Range Status   Specimen Description BLOOD RIGHT ANTECUBITAL  Final   Special Requests   Final    BOTTLES DRAWN AEROBIC AND ANAEROBIC Blood Culture results may not be optimal due to an excessive volume of blood received in culture bottles   Culture   Final    NO GROWTH 3 DAYS Performed at [facility], [address], [place], [place] [address]    Report Status PENDING  Incomplete  Blood  culture (routine x 2)     Status: Abnormal   Collection Time: 11/15/22  7:52 PM   Specimen: BLOOD  Result Value Ref Range Status   Specimen Description   Final    BLOOD BLOOD RIGHT FOREARM Performed at [facility], [address], [place], [place] [address]    Special Requests   Final    BOTTLES DRAWN AEROBIC AND ANAEROBIC Blood Culture results may not be optimal due to an excessive volume of blood received in culture bottles Performed at [facility], [address] [place], [place] [address]    Culture  Setup Time   Final    GRAM POSITIVE COCCI ANAEROBIC BOTTLE ONLY CRITICAL RESULT CALLED TO, READ BACK BY AND VERIFIED WITH: [facility] AT 1612 11/16/22.PMF    Culture (A)  Final    AEROCOCCUS VIRIDANS Standardized susceptibility testing for this organism is not available. Performed at [facility], [address], [place], [place] [address]    Report Status 11/18/2022 FINAL  Final  Blood Culture ID Panel (Reflexed)  Status: None   Collection Time: 11/15/22  7:52 PM  Result Value Ref Range Status   Enterococcus faecalis NOT DETECTED NOT DETECTED Final   Enterococcus Faecium NOT DETECTED NOT DETECTED Final   Listeria monocytogenes NOT DETECTED NOT DETECTED Final   Staphylococcus species NOT DETECTED NOT DETECTED Final   Staphylococcus aureus (BCID) NOT DETECTED NOT DETECTED Final   Staphylococcus epidermidis NOT DETECTED NOT DETECTED Final   Staphylococcus lugdunensis NOT DETECTED NOT DETECTED Final   Streptococcus species NOT DETECTED NOT DETECTED Final   Streptococcus agalactiae NOT DETECTED NOT DETECTED Final   Streptococcus pneumoniae NOT DETECTED NOT DETECTED Final   Streptococcus pyogenes NOT DETECTED NOT DETECTED Final   A.calcoaceticus-baumannii NOT DETECTED NOT DETECTED Final   Bacteroides fragilis NOT DETECTED NOT DETECTED Final   Enterobacterales NOT DETECTED NOT DETECTED Final   Enterobacter cloacae complex NOT DETECTED NOT DETECTED  Final   Escherichia coli NOT DETECTED NOT DETECTED Final   Klebsiella aerogenes NOT DETECTED NOT DETECTED Final   Klebsiella oxytoca NOT DETECTED NOT DETECTED Final   Klebsiella pneumoniae NOT DETECTED NOT DETECTED Final   Proteus species NOT DETECTED NOT DETECTED Final   Salmonella species NOT DETECTED NOT DETECTED Final   Serratia marcescens NOT DETECTED NOT DETECTED Final   Haemophilus influenzae NOT DETECTED NOT DETECTED Final   Neisseria meningitidis NOT DETECTED NOT DETECTED Final   Pseudomonas aeruginosa NOT DETECTED NOT DETECTED Final   Stenotrophomonas maltophilia NOT DETECTED NOT DETECTED Final   Candida albicans NOT DETECTED NOT DETECTED Final   Candida auris NOT DETECTED NOT DETECTED Final   Candida glabrata NOT DETECTED NOT DETECTED Final   Candida krusei NOT DETECTED NOT DETECTED Final   Candida parapsilosis NOT DETECTED NOT DETECTED Final   Candida tropicalis NOT DETECTED NOT DETECTED Final   Cryptococcus neoformans/gattii NOT DETECTED NOT DETECTED Final    Comment: Performed at [facility], [address], [place], [place] [address]  Surgical pcr screen     Status: Abnormal   Collection Time: 11/17/22  9:13 AM   Specimen: Nasal Mucosa; Nasal Swab  Result Value Ref Range Status   MRSA, PCR NEGATIVE NEGATIVE Final   Staphylococcus aureus POSITIVE (A) NEGATIVE Final    Comment: (NOTE) The Xpert SA Assay (FDA approved for NASAL specimens in patients 48 years of age and older), is one component of a comprehensive surveillance program. It is not intended to diagnose infection nor to guide or monitor treatment. Performed at [facility], [address] [place], [place] [address]   Aerobic/Anaerobic Culture w Gram Stain (surgical/deep wound)     Status: None (Preliminary result)   Collection Time: 11/17/22 10:44 AM   Specimen: Wound; Tissue  Result Value Ref Range Status   Specimen Description   Final    WOUND Performed at [facility],  [address] [place], [place] [address]    Special Requests RESIDUAL RIGHT SECOND TOE BONE  Final   Gram Stain   Final    RARE SQUAMOUS EPITHELIAL CELLS PRESENT RARE WBC PRESENT, PREDOMINANTLY PMN NO ORGANISMS SEEN    Culture   Final    NO GROWTH < 24 HOURS Performed at [facility], [address], [place], [place] [address]    Report Status PENDING  Incomplete    Labs: CBC: Recent Labs  Lab 11/15/22 1810 11/17/22 0630  WBC 7.0 4.6  NEUTROABS 4.9  --   HGB 13.1 12.7*  HCT 38.5* 38.0*  MCV 91.0 91.3  PLT 204 190  Basic Metabolic Panel: Recent Labs  Lab 11/15/22 1810 11/17/22 0630  NA 138 136  K 3.9 4.0  CL 105 109  CO2 22 21*  GLUCOSE 167* 145*  BUN 24* 21*  CREATININE 0.92 0.77  CALCIUM 9.3 8.6*   Liver Function Tests: Recent Labs  Lab 11/15/22 1810  AST 23  ALT 34  ALKPHOS 53  BILITOT 1.4*  PROT 7.2  ALBUMIN 4.5   CBG: Recent Labs  Lab 11/16/22 2108 11/17/22 0622 11/17/22 0736 11/17/22 0953 11/17/22 1104  GLUCAP 200* 140* 139* 151* 148*    Discharge time spent: greater than 30 minutes.  Signed: Delfino Lovett, MD [facility] 11/18/2022

## 2022-11-19 LAB — CULTURE, BLOOD (ROUTINE X 2)

## 2022-11-20 LAB — AEROBIC/ANAEROBIC CULTURE W GRAM STAIN (SURGICAL/DEEP WOUND)

## 2022-11-21 LAB — AEROBIC/ANAEROBIC CULTURE W GRAM STAIN (SURGICAL/DEEP WOUND)

## 2022-11-22 ENCOUNTER — Encounter: Payer: Self-pay | Admitting: Podiatry

## 2022-11-22 ENCOUNTER — Ambulatory Visit (INDEPENDENT_AMBULATORY_CARE_PROVIDER_SITE_OTHER): Payer: BC Managed Care – PPO | Admitting: Podiatry

## 2022-11-22 DIAGNOSIS — Z9889 Other specified postprocedural states: Secondary | ICD-10-CM

## 2022-11-22 LAB — AEROBIC/ANAEROBIC CULTURE W GRAM STAIN (SURGICAL/DEEP WOUND)

## 2022-11-22 NOTE — Progress Notes (Signed)
  Subjective:  Patient ID: Adrian Gonzalez, male    DOB: [birth year],  MRN: [id number]  No chief complaint on file.   DOS: 11/17/22 Procedure: Right partial second digit amputation  57 y.o. male returns for POV#1. Relates doing well and minimal pain.   Review of Systems: Negative except as noted in the HPI. Denies N/V/F/Ch.  Past Medical History:  Diagnosis Date   Diabetes mellitus without complication (HCC)    Hypertension     Current Outpatient Medications:    amoxicillin-clavulanate (AUGMENTIN) 500-125 MG tablet, Take 1 tablet by mouth 3 (three) times daily for 10 days., Disp: 30 tablet, Rfl: 0   atorvastatin (LIPITOR) 10 MG tablet, Take 10 mg by mouth daily., Disp: , Rfl:    buPROPion (WELLBUTRIN XL) 300 MG 24 hr tablet, Take 300 mg by mouth daily., Disp: , Rfl:    cyclobenzaprine (FLEXERIL) 10 MG tablet, Take 10 mg by mouth 3 (three) times daily., Disp: , Rfl:    ibuprofen (ADVIL) 600 MG tablet, Take 1 tablet (600 mg total) by mouth every 8 (eight) hours as needed for moderate pain, mild pain, headache or fever., Disp: 21 tablet, Rfl: 0   metFORMIN (GLUCOPHAGE) 1000 MG tablet, Take 1,000-1,500 mg by mouth 2 (two) times daily with a meal., Disp: , Rfl:   Social History   Tobacco Use  Smoking Status Never  Smokeless Tobacco Current   Types: Snuff    No Known Allergies Objective:  There were no vitals filed for this visit. There is no height or weight on file to calculate BMI. Constitutional Well developed. Well nourished.  Vascular Foot warm and well perfused. Capillary refill normal to all digits.   Neurologic Normal speech. Oriented to person, place, and time. Epicritic sensation to light touch grossly present bilaterally.  Dermatologic Skin healing well without signs of infection. Skin edges well coapted without signs of infection.  Orthopedic: Tenderness to palpation noted about the surgical site.   Radiographs: Interval resection of distal right second digit   Assessment:  No diagnosis found. Plan:  Patient was evaluated and treated and all questions answered.  S/p foot surgery right -Progressing as expected post-operatively. -WB Status: WBAT in surgical shoe -Sutures: intact. -Medications: n/a -Foot redressed.  Follow-up in 2 weeks for suture removal.   No follow-ups on file.

## 2022-12-04 ENCOUNTER — Encounter: Payer: BC Managed Care – PPO | Admitting: Podiatry

## 2022-12-06 ENCOUNTER — Ambulatory Visit (INDEPENDENT_AMBULATORY_CARE_PROVIDER_SITE_OTHER): Payer: BC Managed Care – PPO | Admitting: Podiatry

## 2022-12-06 ENCOUNTER — Encounter: Payer: Self-pay | Admitting: Podiatry

## 2022-12-06 DIAGNOSIS — Z9889 Other specified postprocedural states: Secondary | ICD-10-CM | POA: Diagnosis not present

## 2022-12-06 NOTE — Progress Notes (Signed)
  Subjective:  Patient ID: Adrian Gonzalez, male    DOB: [birth year],  MRN: [id number]  Chief Complaint  Patient presents with   Routine Post Op    Pt came in today for  a post op. Pt denies pain pt stated that he is feeling a lot better.    DOS: 11/17/22 Procedure: Right partial second digit amputation  58 y.o. male returns for POV#2. Relates doing well and minimal pain.   Review of Systems: Negative except as noted in the HPI. Denies N/V/F/Ch.  Past Medical History:  Diagnosis Date   Diabetes mellitus without complication (HCC)    Hypertension     Current Outpatient Medications:    atorvastatin (LIPITOR) 10 MG tablet, Take 10 mg by mouth daily., Disp: , Rfl:    buPROPion (WELLBUTRIN XL) 300 MG 24 hr tablet, Take 300 mg by mouth daily., Disp: , Rfl:    cyclobenzaprine (FLEXERIL) 10 MG tablet, Take 10 mg by mouth 3 (three) times daily., Disp: , Rfl:    ibuprofen (ADVIL) 600 MG tablet, Take 1 tablet (600 mg total) by mouth every 8 (eight) hours as needed for moderate pain, mild pain, headache or fever., Disp: 21 tablet, Rfl: 0   metFORMIN (GLUCOPHAGE) 1000 MG tablet, Take 1,000-1,500 mg by mouth 2 (two) times daily with a meal., Disp: , Rfl:   Social History   Tobacco Use  Smoking Status Never  Smokeless Tobacco Current   Types: Snuff    No Known Allergies Objective:  There were no vitals filed for this visit. There is no height or weight on file to calculate BMI. Constitutional Well developed. Well nourished.  Vascular Foot warm and well perfused. Capillary refill normal to all digits.   Neurologic Normal speech. Oriented to person, place, and time. Epicritic sensation to light touch grossly present bilaterally.  Dermatologic Skin healing well without signs of infection. Skin edges well coapted without signs of infection.  Orthopedic: Tenderness to palpation noted about the surgical site.   Radiographs: Interval resection of distal right second digit  Assessment:   1.  Status post surgery    Plan:  Patient was evaluated and treated and all questions answered.  S/p foot surgery right -Progressing as expected post-operatively. -WB Status: WBAT in regular shoe -Sutures: removed today without incident.  -Medications: n/a -Foot redressed. May begin showering normally starting tomorrow.   Follow-up in 3 weeks for recheck  Return in about 3 weeks (around 12/27/2022) for post op.

## 2022-12-25 ENCOUNTER — Ambulatory Visit (INDEPENDENT_AMBULATORY_CARE_PROVIDER_SITE_OTHER): Payer: BC Managed Care – PPO | Admitting: Podiatry

## 2022-12-25 DIAGNOSIS — Z91199 Patient's noncompliance with other medical treatment and regimen due to unspecified reason: Secondary | ICD-10-CM

## 2022-12-25 NOTE — Progress Notes (Signed)
No show

## 2022-12-25 NOTE — Addendum Note (Signed)
Addended by: Louann Sjogren R on: 12/25/2022 08:32 AM   Modules accepted: Level of Service

## 2023-01-08 ENCOUNTER — Ambulatory Visit (INDEPENDENT_AMBULATORY_CARE_PROVIDER_SITE_OTHER): Payer: BC Managed Care – PPO | Admitting: Podiatry

## 2023-01-08 ENCOUNTER — Encounter: Payer: Self-pay | Admitting: Podiatry

## 2023-01-08 DIAGNOSIS — M2041 Other hammer toe(s) (acquired), right foot: Secondary | ICD-10-CM | POA: Diagnosis not present

## 2023-01-08 DIAGNOSIS — M2011 Hallux valgus (acquired), right foot: Secondary | ICD-10-CM

## 2023-01-08 DIAGNOSIS — M2012 Hallux valgus (acquired), left foot: Secondary | ICD-10-CM

## 2023-01-08 DIAGNOSIS — M2042 Other hammer toe(s) (acquired), left foot: Secondary | ICD-10-CM

## 2023-01-08 DIAGNOSIS — E1110 Type 2 diabetes mellitus with ketoacidosis without coma: Secondary | ICD-10-CM

## 2023-01-08 DIAGNOSIS — E1142 Type 2 diabetes mellitus with diabetic polyneuropathy: Secondary | ICD-10-CM | POA: Diagnosis not present

## 2023-01-08 DIAGNOSIS — Z89421 Acquired absence of other right toe(s): Secondary | ICD-10-CM | POA: Diagnosis not present

## 2023-01-08 DIAGNOSIS — Z9889 Other specified postprocedural states: Secondary | ICD-10-CM

## 2023-01-08 DIAGNOSIS — L84 Corns and callosities: Secondary | ICD-10-CM

## 2023-01-08 MED ORDER — GABAPENTIN 300 MG PO CAPS: 300.0000 mg | ORAL_CAPSULE | Freq: Every day | ORAL | 3 refills | Status: AC

## 2023-01-08 NOTE — Progress Notes (Signed)
  Subjective:  Patient ID: Adrian Gonzalez, male    DOB: 11-04-1965,  MRN: [id number]  No chief complaint on file.   DOS: 11/17/22 Procedure: Right partial second digit amputation  57 y.o. male returns for POV#3. Relates doing well and minimal pain.   Review of Systems: Negative except as noted in the HPI. Denies N/V/F/Ch.  Past Medical History:  Diagnosis Date   Diabetes mellitus without complication (HCC)    Hypertension     Current Outpatient Medications:    atorvastatin (LIPITOR) 10 MG tablet, Take 10 mg by mouth daily., Disp: , Rfl:    buPROPion (WELLBUTRIN XL) 300 MG 24 hr tablet, Take 300 mg by mouth daily., Disp: , Rfl:    cyclobenzaprine (FLEXERIL) 10 MG tablet, Take 10 mg by mouth 3 (three) times daily., Disp: , Rfl:    gabapentin (NEURONTIN) 300 MG capsule, Take 1 capsule (300 mg total) by mouth at bedtime., Disp: 90 capsule, Rfl: 3   ibuprofen (ADVIL) 600 MG tablet, Take 1 tablet (600 mg total) by mouth every 8 (eight) hours as needed for moderate pain, mild pain, headache or fever., Disp: 21 tablet, Rfl: 0   metFORMIN (GLUCOPHAGE) 1000 MG tablet, Take 1,000-1,500 mg by mouth 2 (two) times daily with a meal., Disp: , Rfl:   Social History   Tobacco Use  Smoking Status Never  Smokeless Tobacco Current   Types: Snuff    No Known Allergies Objective:  There were no vitals filed for this visit. There is no height or weight on file to calculate BMI. Constitutional Well developed. Well nourished.  Vascular Foot warm and well perfused. Capillary refill normal to all digits.   Neurologic Normal speech. Oriented to person, place, and time. Epicritic sensation to light touch grossly present bilaterally.  Dermatologic Skin healing well without signs of infection. Skin edges well coapted without signs of infection.  Orthopedic: Tenderness to palpation noted about the surgical site.   Radiographs: Interval resection of distal right second digit  Assessment:   1. DM  (diabetes mellitus) type 2, uncontrolled, with ketoacidosis (HCC)   2. Status post surgery   3. Diabetic peripheral neuropathy (HCC)   4. History of amputation of lesser toe of right foot (HCC)   5. Hammertoe of left foot   6. Hammertoe of right foot   7. Hallux valgus with bunions, left   8. Hallux valgus with bunions, right   9. Callus of foot     Plan:  Patient was evaluated and treated and all questions answered.  S/p foot surgery right -Progressing as expected post-operatively. -WB Status: WBAT in regular shoe  -Medications: Gabapentin sent in for nerve pain.  -Will get shceduled for diabetic shoe fitting.   Follow-up in 3 months for rfc.   Return in about 3 months (around 04/10/2023) for rfc.

## 2023-01-25 ENCOUNTER — Ambulatory Visit: Payer: BC Managed Care – PPO

## 2023-01-25 DIAGNOSIS — M2041 Other hammer toe(s) (acquired), right foot: Secondary | ICD-10-CM

## 2023-01-25 DIAGNOSIS — E1110 Type 2 diabetes mellitus with ketoacidosis without coma: Secondary | ICD-10-CM

## 2023-01-25 DIAGNOSIS — Z89421 Acquired absence of other right toe(s): Secondary | ICD-10-CM

## 2023-01-25 DIAGNOSIS — M2011 Hallux valgus (acquired), right foot: Secondary | ICD-10-CM

## 2023-01-25 DIAGNOSIS — L84 Corns and callosities: Secondary | ICD-10-CM

## 2023-01-25 DIAGNOSIS — M2012 Hallux valgus (acquired), left foot: Secondary | ICD-10-CM

## 2023-01-25 DIAGNOSIS — E1142 Type 2 diabetes mellitus with diabetic polyneuropathy: Secondary | ICD-10-CM

## 2023-01-25 DIAGNOSIS — M2042 Other hammer toe(s) (acquired), left foot: Secondary | ICD-10-CM

## 2023-01-25 NOTE — Progress Notes (Signed)
Patient presents to the office today for diabetic shoe and insole measuring.  Patient was measured with brannock device to determine size and width for 1 pair of extra depth shoes and foam casted for 3 pair of insoles with MT pads   Documentation of medical necessity will be sent to patient's treating diabetic doctor to verify and sign.   Patient's diabetic provider: W. Elkins  Shoes and insoles will be ordered at that time and patient will be notified for an appointment for fitting when they arrive.   Shoe size (per patient): 15 Brannock measurement: 14 Patient shoe selection- Shoe choice:   484  / 2nd any pair of boot  Shoe size ordered: 15WD  Need financial paperwork signed  Addison Bailey CPed, CFo, CFm

## 2023-02-26 ENCOUNTER — Encounter: Payer: Self-pay | Admitting: Podiatry

## 2023-02-26 ENCOUNTER — Ambulatory Visit (INDEPENDENT_AMBULATORY_CARE_PROVIDER_SITE_OTHER): Payer: BC Managed Care – PPO | Admitting: Podiatry

## 2023-02-26 DIAGNOSIS — M79675 Pain in left toe(s): Secondary | ICD-10-CM | POA: Diagnosis not present

## 2023-02-26 DIAGNOSIS — E1142 Type 2 diabetes mellitus with diabetic polyneuropathy: Secondary | ICD-10-CM

## 2023-02-26 DIAGNOSIS — B351 Tinea unguium: Secondary | ICD-10-CM

## 2023-02-26 DIAGNOSIS — M79674 Pain in right toe(s): Secondary | ICD-10-CM

## 2023-02-26 NOTE — Progress Notes (Signed)
  Subjective:  Patient ID: Adrian Gonzalez, male    DOB: Oct 02, 1965,   MRN: [id number]  No chief complaint on file.   57 y.o. male presents for concern of thickened elongated and painful nails that are difficult to trim. Requesting to have them trimmed today. Relates burning and tingling in their feet. Patient is diabetic and last A1c was  Lab Results  Component Value Date   HGBA1C 6.9 (H) 11/15/2022   .   PCP:  [facility]    . Denies any other pedal complaints. Denies n/v/f/c.   Past Medical History:  Diagnosis Date   Diabetes mellitus without complication (HCC)    Hypertension     Objective:  Physical Exam: Vascular: DP/PT pulses 2/4 bilateral. CFT <3 seconds. Absent hair growth on digits. Edema noted to bilateral lower extremities. Xerosis noted bilaterally.  Skin. No lacerations or abrasions bilateral feet. Nails 1-5 bilateral  are thickened discolored and elongated with subungual debris minus right second digit. Hyperkeratotic lesion noted distal right third digit and lateral left fifth digit.  Musculoskeletal: MMT 5/5 bilateral lower extremities in DF, PF, Inversion and Eversion. Deceased ROM in DF of ankle joint. Partial right second digit amputation.  Neurological: Sensation intact to light touch. Protective sensation diminished bilateral.    Assessment:   1. Diabetic peripheral neuropathy (HCC)   2. Pain due to onychomycosis of toenails of both feet      Plan:  Patient was evaluated and treated and all questions answered. -Discussed and educated patient on diabetic foot care, especially with  regards to the vascular, neurological and musculoskeletal systems.  -Stressed the importance of good glycemic control and the detriment of not  controlling glucose levels in relation to the foot. -Discussed supportive shoes at all times and checking feet regularly.  -Mechanically debrided all nails 1-5 bilateral using sterile nail nipper and filed with  dremel without incident -Awaiting diabetic shoes.  -Left hallux nail lateral border debrided in slant back fashion and neosporin and bandaid placed. Distal right third digit with hyperkeratosis debrided as well and lateral left fifth digit hyperkeratosis debrided with chisel.   -Answered all patient questions -Patient to return  in 3 months for at risk foot care -Patient advised to call the office if any problems or questions arise in the meantime.   Louann Sjogren, DPM

## 2023-03-23 ENCOUNTER — Telehealth: Payer: Self-pay | Admitting: Podiatry

## 2023-03-23 NOTE — Telephone Encounter (Signed)
Pt called checking on orthotics but upon looking it was for diabetic shoes and inserts.  Checked [facility] and the diabetic shoes pt ordered are on back or for 2-3 wks from 10/31. I did check for other boots in his size but they did not have any.   I notified pt that the shoes are on back order for a few weeks and we would call when they come in.   He said thank you for helping him

## 2023-03-28 ENCOUNTER — Telehealth: Payer: Self-pay | Admitting: Podiatry

## 2023-03-28 NOTE — Telephone Encounter (Signed)
Adrian Gonzalez called in asking about his workman's comp case and his standing since he lost a toe (not sure I understood) -- he saw Dr. Ralene Cork mult times who ordered some ortho shoes and wanted to s/w her again.  Per 11/11 notes, the shoes are on back order but we would call the pt once received.  His next appt with Dr. Ralene Cork is 05/29/2023 but he wanted to try to get in sooner.  Started to transfer him to schedule but we were disconnected.   (03/22/2023) - sent office visit notes to Lovena Le with [employer] - he was working on Patients workman's comp case.       (J. Abbott -- 03/28/2023)

## 2023-04-03 ENCOUNTER — Ambulatory Visit (INDEPENDENT_AMBULATORY_CARE_PROVIDER_SITE_OTHER): Payer: BC Managed Care – PPO | Admitting: Podiatry

## 2023-04-03 ENCOUNTER — Telehealth: Payer: Self-pay | Admitting: Podiatry

## 2023-04-03 ENCOUNTER — Ambulatory Visit: Payer: BC Managed Care – PPO | Admitting: Podiatry

## 2023-04-03 ENCOUNTER — Encounter: Payer: Self-pay | Admitting: Podiatry

## 2023-04-03 DIAGNOSIS — E1142 Type 2 diabetes mellitus with diabetic polyneuropathy: Secondary | ICD-10-CM | POA: Diagnosis not present

## 2023-04-03 DIAGNOSIS — E1110 Type 2 diabetes mellitus with ketoacidosis without coma: Secondary | ICD-10-CM | POA: Diagnosis not present

## 2023-04-03 DIAGNOSIS — L84 Corns and callosities: Secondary | ICD-10-CM

## 2023-04-03 DIAGNOSIS — Z89421 Acquired absence of other right toe(s): Secondary | ICD-10-CM | POA: Diagnosis not present

## 2023-04-03 MED ORDER — GABAPENTIN 300 MG PO CAPS: 300.0000 mg | ORAL_CAPSULE | Freq: Two times a day (BID) | ORAL | 3 refills | Status: DC

## 2023-04-03 NOTE — Progress Notes (Signed)
  Subjective:  Patient ID: Adrian Gonzalez, male    DOB: 29-Nov-1965,   MRN: [id number]  No chief complaint on file.   57 y.o. male presents for concern of increasing pain in both of his feet. Has been taking gabapentin and helps but still getting pain. Also had some callus build up he wanted checked. . Relates burning and tingling in their feet. Patient is diabetic and last A1c was  Lab Results  Component Value Date   HGBA1C 6.9 (H) 11/15/2022   .   PCP:  [facility]    . Denies any other pedal complaints. Denies n/v/f/c.   Past Medical History:  Diagnosis Date   Diabetes mellitus without complication (HCC)    Hypertension     Objective:  Physical Exam: Vascular: DP/PT pulses 2/4 bilateral. CFT <3 seconds. Absent hair growth on digits. Edema noted to bilateral lower extremities. Xerosis noted bilaterally.  Skin. No lacerations or abrasions bilateral feet. Nails 1-5 bilateral  are thickened discolored and elongated with subungual debris minus right second digit. Hyperkeratotic lesion noted distal right third digit and lateral left fifth digit.  Musculoskeletal: MMT 5/5 bilateral lower extremities in DF, PF, Inversion and Eversion. Deceased ROM in DF of ankle joint. Partial right second digit amputation.  Neurological: Sensation intact to light touch. Protective sensation diminished bilateral.    Assessment:   1. Diabetic peripheral neuropathy (HCC)   2. DM (diabetes mellitus) type 2, uncontrolled, with ketoacidosis (HCC)   3. History of amputation of lesser toe of right foot (HCC)   4. Callus of foot       Plan:  Patient was evaluated and treated and all questions answered. -Discussed and educated patient on diabetic foot care, especially with  regards to the vascular, neurological and musculoskeletal systems.  -Stressed the importance of good glycemic control and the detriment of not  controlling glucose levels in relation to the foot. -Discussed  supportive shoes at all times and checking feet regularly.  -Awaiting diabetic shoes.  -Increased gabapentin dosage to 300 mg BID.   -Answered all patient questions -Patient to return  in 2 months for at risk foot care -Patient advised to call the office if any problems or questions arise in the meantime.   Louann Sjogren, DPM

## 2023-04-03 NOTE — Telephone Encounter (Signed)
Patient called stating the pharmacy has not gotten the recommended dosage for this pt, please advise  Thanks!

## 2023-04-04 NOTE — Telephone Encounter (Signed)
Spoke with patient today then emailed [facility] to see if we can get ETA on boots that are on B/O  Will call patient and update once I hear back  Addison Bailey Cped

## 2023-04-23 ENCOUNTER — Telehealth: Payer: Self-pay | Admitting: Podiatry

## 2023-04-23 NOTE — Telephone Encounter (Signed)
Pt called checking on status of his diabetic shoes, he is starting to get concerned about his feet and does not want to have any additional amputations.   I checked and [facility] sent an email stating that the shoe that was ordered was not available in size 15. I did check and the 681 black boot shows that size 15 w is available in that shoe. He would like to go ahead and proceed with this shoe. If it is not available he is wanting a tie up shoe that would be available in his size that would be ok to wear to work.

## 2023-05-29 ENCOUNTER — Ambulatory Visit (INDEPENDENT_AMBULATORY_CARE_PROVIDER_SITE_OTHER): Payer: BC Managed Care – PPO | Admitting: Podiatry

## 2023-05-29 ENCOUNTER — Encounter: Payer: Self-pay | Admitting: Podiatry

## 2023-05-29 DIAGNOSIS — M79674 Pain in right toe(s): Secondary | ICD-10-CM

## 2023-05-29 DIAGNOSIS — M79675 Pain in left toe(s): Secondary | ICD-10-CM | POA: Diagnosis not present

## 2023-05-29 DIAGNOSIS — L84 Corns and callosities: Secondary | ICD-10-CM | POA: Diagnosis not present

## 2023-05-29 DIAGNOSIS — E1110 Type 2 diabetes mellitus with ketoacidosis without coma: Secondary | ICD-10-CM | POA: Diagnosis not present

## 2023-05-29 DIAGNOSIS — B351 Tinea unguium: Secondary | ICD-10-CM

## 2023-05-29 DIAGNOSIS — E1142 Type 2 diabetes mellitus with diabetic polyneuropathy: Secondary | ICD-10-CM | POA: Diagnosis not present

## 2023-05-29 NOTE — Progress Notes (Signed)

## 2023-05-29 NOTE — Progress Notes (Signed)
  Subjective:  Patient ID: Adrian Gonzalez, male    DOB: [birth year],   MRN: [id number]  No chief complaint on file.   58 y.o. male presents for concern of thickened elongated and painful nails that are difficult to trim. Requesting to have them trimmed today. Relates burning and tingling in their feet. Patient is diabetic and last A1c was  Lab Results  Component Value Date   HGBA1C 6.9 (H) 11/15/2022   .   PCP:  [facility]     Past Medical History:  Diagnosis Date   Diabetes mellitus without complication (HCC)    Hypertension     Objective:  Physical Exam: Vascular: DP/PT pulses 2/4 bilateral. CFT <3 seconds. Absent hair growth on digits. Edema noted to bilateral lower extremities. Xerosis noted bilaterally.  Skin. No lacerations or abrasions bilateral feet. Nails 1-5 bilateral  are thickened discolored and elongated with subungual debris minus right second digit. Hyperkeratotic lesion noted distal right third digit and lateral left fifth digit.  Musculoskeletal: MMT 5/5 bilateral lower extremities in DF, PF, Inversion and Eversion. Deceased ROM in DF of ankle joint. Partial right second digit amputation.  Neurological: Sensation intact to light touch. Protective sensation diminished bilateral.    Assessment:   1. Pain due to onychomycosis of toenails of both feet   2. Callus of foot   3. DM (diabetes mellitus) type 2, uncontrolled, with ketoacidosis (HCC)   4. Diabetic peripheral neuropathy (HCC)       Plan:  Patient was evaluated and treated and all questions answered. -Discussed and educated patient on diabetic foot care, especially with  regards to the vascular, neurological and musculoskeletal systems.  -Stressed the importance of good glycemic control and the detriment of not  controlling glucose levels in relation to the foot. -Discussed supportive shoes at all times and checking feet regularly.  -Mechanically remaining digits 1-5 bilateral  with nail nipper without incident.  -Hyperkeratotic lesion right third digit debrided without incident.  -From his toe perspective patient has reached maximum medical improvement. He will need continuous ongoing care and checkups to assure no worsening of the foot.  -Diabetic shoes dispensed.  -Gabapentin  dosage to 300 mg BID.   -Answered all patient questions -Patient to return  in 3 months for at risk foot care -Patient advised to call the office if any problems or questions arise in the meantime.   Asberry Failing, DPM

## 2023-06-04 ENCOUNTER — Ambulatory Visit: Payer: BC Managed Care – PPO | Admitting: Podiatry

## 2023-06-15 ENCOUNTER — Telehealth: Payer: Self-pay | Admitting: Podiatry

## 2023-06-15 NOTE — Telephone Encounter (Signed)
Patient called inquiring about his rating - (referring to [employer] - he said his injury is workman's comp related).  Reached out to Dr. Ralene Cork who said the MMI rating is the "Maximum Medical Improvement" and this patient's MMI rating is 100%.  I gave this same information to Mr. Agosto  ....   Georgina Snell -- 06-15-23

## 2023-07-19 ENCOUNTER — Telehealth: Payer: Self-pay | Admitting: Podiatry

## 2023-07-19 NOTE — Telephone Encounter (Signed)
 Patient called and left a mess for a call back. I called [phone] and left mess on VM for him.

## 2023-07-23 ENCOUNTER — Telehealth: Payer: Self-pay | Admitting: Podiatry

## 2023-07-23 DIAGNOSIS — Z0271 Encounter for disability determination: Secondary | ICD-10-CM

## 2023-07-23 NOTE — Telephone Encounter (Signed)
 Returned call to pt at [phone].Adrian Gonzalez He will have W/C rep fax forms to be completed indicating from Dr. Ralene Cork he is at 100% MMI-see Jan notes. I advised would get completed and let know when being faxed back to them. He has question about his billing of shoes. I adv would send to billing for review and call him back about it.

## 2023-07-23 NOTE — Telephone Encounter (Signed)
 Recd forms for permanent impairment. I sent note to Dr. Ralene Cork to confirm if notes from Jan 2025 of MMI 100% still valid. During conversation with the pt this morning, he mentioned he thought he was disabled????

## 2023-07-23 NOTE — Telephone Encounter (Signed)
 [facility] [phone] Permanent Impairment forms. Confirmed with Dr Ralene Cork of the MMI 100%- LTD

## 2023-08-28 ENCOUNTER — Ambulatory Visit (INDEPENDENT_AMBULATORY_CARE_PROVIDER_SITE_OTHER): Payer: BC Managed Care – PPO | Admitting: Podiatry

## 2023-08-28 ENCOUNTER — Encounter: Payer: Self-pay | Admitting: Podiatry

## 2023-08-28 DIAGNOSIS — M79675 Pain in left toe(s): Secondary | ICD-10-CM

## 2023-08-28 DIAGNOSIS — M79674 Pain in right toe(s): Secondary | ICD-10-CM

## 2023-08-28 DIAGNOSIS — B351 Tinea unguium: Secondary | ICD-10-CM

## 2023-08-28 DIAGNOSIS — E1142 Type 2 diabetes mellitus with diabetic polyneuropathy: Secondary | ICD-10-CM

## 2023-08-28 DIAGNOSIS — E1110 Type 2 diabetes mellitus with ketoacidosis without coma: Secondary | ICD-10-CM

## 2023-08-28 NOTE — Progress Notes (Signed)
  Subjective:  Patient ID: Adrian Gonzalez, male    DOB: [birth year],   MRN: [id number]  No chief complaint on file.   58 y.o. male presents for concern of thickened elongated and painful nails that are difficult to trim. Requesting to have them trimmed today. Relates burning and tingling in their feet. Patient is diabetic and last A1c was  Lab Results  Component Value Date   HGBA1C 6.9 (H) 11/15/2022   .   PCP:  [facility]     Past Medical History:  Diagnosis Date   Diabetes mellitus without complication (HCC)    Hypertension     Objective:  Physical Exam: Vascular: DP/PT pulses 2/4 bilateral. CFT <3 seconds. Absent hair growth on digits. Edema noted to bilateral lower extremities. Xerosis noted bilaterally.  Skin. No lacerations or abrasions bilateral feet. Nails 1-5 bilateral  are thickened discolored and elongated with subungual debris minus right second digit. Hyperkeratotic lesion noted distal right third digit and lateral left fifth digit.  Musculoskeletal: MMT 5/5 bilateral lower extremities in DF, PF, Inversion and Eversion. Deceased ROM in DF of ankle joint. Partial right second digit amputation.  Neurological: Sensation intact to light touch. Protective sensation diminished bilateral.    Assessment:   1. Pain due to onychomycosis of toenails of both feet   2. DM (diabetes mellitus) type 2, uncontrolled, with ketoacidosis (HCC)   3. Diabetic peripheral neuropathy (HCC)       Plan:  Patient was evaluated and treated and all questions answered. -Discussed and educated patient on diabetic foot care, especially with  regards to the vascular, neurological and musculoskeletal systems.  -Stressed the importance of good glycemic control and the detriment of not  controlling glucose levels in relation to the foot. -Discussed supportive shoes at all times and checking feet regularly.  -Mechanically remaining digits 1-5 bilateral with nail nipper without  incident.  -Hyperkeratotic lesion right third digit debrided without incident.  -From his toe perspective patient has reached maximum medical improvement. He will need continuous ongoing care and checkups to assure no worsening of the foot.   -Gabapentin dosage to 300 mg BID.   -Answered all patient questions -Patient to return  in 3 months for at risk foot care -Patient advised to call the office if any problems or questions arise in the meantime.   Jennefer Moats, DPM

## 2023-09-30 ENCOUNTER — Other Ambulatory Visit: Payer: Self-pay | Admitting: Podiatry

## 2023-10-03 ENCOUNTER — Telehealth: Payer: Self-pay | Admitting: Podiatry

## 2023-10-03 NOTE — Telephone Encounter (Signed)
 Refaxed [employer] [phone] showing 100% MMI LTD.  Pt left message to get updated.

## 2023-10-03 NOTE — Telephone Encounter (Signed)
 Thank you :)

## 2023-10-03 NOTE — Telephone Encounter (Signed)
 Fax [phone] On form #25R has a check mark where there should be a percentage of the disability in toe.

## 2023-10-23 ENCOUNTER — Telehealth: Payer: Self-pay | Admitting: Podiatry

## 2023-10-23 NOTE — Telephone Encounter (Signed)
 See notes

## 2023-10-23 NOTE — Telephone Encounter (Signed)
 pt lft mess on my vmail. I called him back and he has questions about this orthodics. I adv would get mess to Kerney Pee to call him back once she returns. Sending mess to her to call pt back at [phone]

## 2023-11-16 ENCOUNTER — Other Ambulatory Visit: Payer: Self-pay | Admitting: Podiatry

## 2023-11-28 ENCOUNTER — Ambulatory Visit: Admitting: Podiatry

## 2023-12-18 ENCOUNTER — Ambulatory Visit: Admitting: Podiatry

## 2024-01-01 ENCOUNTER — Encounter: Payer: Self-pay | Admitting: Podiatry

## 2024-01-01 ENCOUNTER — Ambulatory Visit (INDEPENDENT_AMBULATORY_CARE_PROVIDER_SITE_OTHER): Admitting: Podiatry

## 2024-01-01 ENCOUNTER — Other Ambulatory Visit: Payer: Self-pay | Admitting: Podiatry

## 2024-01-01 DIAGNOSIS — E1142 Type 2 diabetes mellitus with diabetic polyneuropathy: Secondary | ICD-10-CM

## 2024-01-01 DIAGNOSIS — M79674 Pain in right toe(s): Secondary | ICD-10-CM

## 2024-01-01 DIAGNOSIS — M79675 Pain in left toe(s): Secondary | ICD-10-CM | POA: Diagnosis not present

## 2024-01-01 DIAGNOSIS — B351 Tinea unguium: Secondary | ICD-10-CM

## 2024-01-01 NOTE — Progress Notes (Signed)
  Subjective:  Patient ID: Adrian Gonzalez, male    DOB: March 16, 1966,   MRN: [id number]  Chief Complaint  Patient presents with   Diabetes    I'm here for a check-up.  Saw Dr. Loring - 1 month ago; A1c 6.2    58 y.o. male presents for concern of thickened elongated and painful nails that are difficult to trim. Requesting to have them trimmed today. Relates burning and tingling in their feet. Patient is diabetic and last A1c was  Lab Results  Component Value Date   HGBA1C 6.9 (H) 11/15/2022   .   PCP:  [facility]     Past Medical History:  Diagnosis Date   Diabetes mellitus without complication (HCC)    Hypertension     Objective:  Physical Exam: Vascular: DP/PT pulses 2/4 bilateral. CFT <3 seconds. Absent hair growth on digits. Edema noted to bilateral lower extremities. Xerosis noted bilaterally.  Skin. No lacerations or abrasions bilateral feet. Nails 1-5 bilateral  are thickened discolored and elongated with subungual debris minus right second digit.  Musculoskeletal: MMT 5/5 bilateral lower extremities in DF, PF, Inversion and Eversion. Deceased ROM in DF of ankle joint. Partial right second digit amputation.  Neurological: Sensation intact to light touch. Protective sensation diminished bilateral.    Assessment:   1. Pain due to onychomycosis of toenails of both feet   2. Diabetic peripheral neuropathy (HCC)        Plan:  Patient was evaluated and treated and all questions answered. -Discussed and educated patient on diabetic foot care, especially with  regards to the vascular, neurological and musculoskeletal systems.  -Stressed the importance of good glycemic control and the detriment of not  controlling glucose levels in relation to the foot. -Discussed supportive shoes at all times and checking feet regularly.  -Mechanically remaining digits 1-5 bilateral with nail nipper without incident.  -From his toe perspective patient has reached  maximum medical improvement. He will need continuous ongoing care and checkups to assure no worsening of the foot.   -Gabapentin  dosage to 300 mg BID.  Continue.  -Answered all patient questions -Patient to return  in 3 months for at risk foot care -Patient advised to call the office if any problems or questions arise in the meantime.   Asberry Failing, DPM

## 2024-02-14 ENCOUNTER — Other Ambulatory Visit: Payer: Self-pay | Admitting: Podiatry

## 2024-04-01 ENCOUNTER — Other Ambulatory Visit: Payer: Self-pay | Admitting: Podiatry

## 2024-04-02 ENCOUNTER — Ambulatory Visit: Admitting: Podiatry

## 2024-04-02 ENCOUNTER — Encounter: Payer: Self-pay | Admitting: Podiatry

## 2024-04-02 DIAGNOSIS — B351 Tinea unguium: Secondary | ICD-10-CM

## 2024-04-02 DIAGNOSIS — M79675 Pain in left toe(s): Secondary | ICD-10-CM | POA: Diagnosis not present

## 2024-04-02 DIAGNOSIS — M79674 Pain in right toe(s): Secondary | ICD-10-CM

## 2024-04-02 DIAGNOSIS — E1142 Type 2 diabetes mellitus with diabetic polyneuropathy: Secondary | ICD-10-CM

## 2024-04-02 NOTE — Progress Notes (Signed)
  Subjective:  Patient ID: Adrian Gonzalez, male    DOB: [birth year],   MRN: [id number]  Chief Complaint  Patient presents with   Diabetes    She's going to check my toes.  Saw Dr. Loring - 1 month ago ; A1c - 6.3    58 y.o. male presents for concern of thickened elongated and painful nails that are difficult to trim. Requesting to have them trimmed today. Relates burning and tingling in their feet. Patient is diabetic and last A1c was  Lab Results  Component Value Date   HGBA1C 6.9 (H) 11/15/2022   .   PCP:  [facility]     Past Medical History:  Diagnosis Date   Diabetes mellitus without complication (HCC)    Hypertension     Objective:  Physical Exam: Vascular: DP/PT pulses 2/4 bilateral. CFT <3 seconds. Absent hair growth on digits. Edema noted to bilateral lower extremities. Xerosis noted bilaterally.  Skin. No lacerations or abrasions bilateral feet. Nails 1-5 bilateral  are thickened discolored and elongated with subungual debris minus right second digit.  Musculoskeletal: MMT 5/5 bilateral lower extremities in DF, PF, Inversion and Eversion. Deceased ROM in DF of ankle joint. Partial right second digit amputation.  Neurological: Sensation intact to light touch. Protective sensation diminished bilateral.    Assessment:   1. Pain due to onychomycosis of toenails of both feet   2. Diabetic peripheral neuropathy (HCC)        Plan:  Patient was evaluated and treated and all questions answered. -Discussed and educated patient on diabetic foot care, especially with  regards to the vascular, neurological and musculoskeletal systems.  -Stressed the importance of good glycemic control and the detriment of not  controlling glucose levels in relation to the foot. -Discussed supportive shoes at all times and checking feet regularly.  -Mechanically remaining digits 1-5 bilateral with nail nipper without incident.  -From his toe perspective patient has  reached maximum medical improvement. He will need continuous ongoing care and checkups to assure no worsening of the foot.   -Gabapentin  dosage to 300 mg BID.  Continue.  -Answered all patient questions -Patient to return  in 3 months for at risk foot care -Patient advised to call the office if any problems or questions arise in the meantime.   Asberry Failing, DPM

## 2024-05-14 ENCOUNTER — Other Ambulatory Visit: Payer: Self-pay | Admitting: Podiatry

## 2024-07-02 ENCOUNTER — Ambulatory Visit: Admitting: Podiatry
